# Patient Record
Sex: Male | Born: 1965 | Race: Black or African American | Hispanic: No | Marital: Married | Smoking: Current every day smoker
Health system: Southern US, Community
[De-identification: ages and names within clinical notes are randomized; demographics above are authoritative.]

## PROBLEM LIST (undated history)

## (undated) DIAGNOSIS — G8929 Other chronic pain: Secondary | ICD-10-CM

## (undated) DIAGNOSIS — R6882 Decreased libido: Secondary | ICD-10-CM

## (undated) DIAGNOSIS — D573 Sickle-cell trait: Secondary | ICD-10-CM

## (undated) DIAGNOSIS — M1990 Unspecified osteoarthritis, unspecified site: Secondary | ICD-10-CM

## (undated) DIAGNOSIS — K429 Umbilical hernia without obstruction or gangrene: Secondary | ICD-10-CM

## (undated) DIAGNOSIS — M25531 Pain in right wrist: Secondary | ICD-10-CM

## (undated) DIAGNOSIS — E119 Type 2 diabetes mellitus without complications: Secondary | ICD-10-CM

## (undated) DIAGNOSIS — F17200 Nicotine dependence, unspecified, uncomplicated: Secondary | ICD-10-CM

## (undated) DIAGNOSIS — M25539 Pain in unspecified wrist: Secondary | ICD-10-CM

## (undated) DIAGNOSIS — M25561 Pain in right knee: Secondary | ICD-10-CM

## (undated) DIAGNOSIS — G5600 Carpal tunnel syndrome, unspecified upper limb: Secondary | ICD-10-CM

## (undated) DIAGNOSIS — M25562 Pain in left knee: Secondary | ICD-10-CM

## (undated) DIAGNOSIS — E669 Obesity, unspecified: Secondary | ICD-10-CM

## (undated) DIAGNOSIS — M25532 Pain in left wrist: Secondary | ICD-10-CM

## (undated) DIAGNOSIS — M25439 Effusion, unspecified wrist: Secondary | ICD-10-CM

## (undated) DIAGNOSIS — M67431 Ganglion, right wrist: Secondary | ICD-10-CM

## (undated) HISTORY — DX: Carpal tunnel syndrome, unspecified upper limb: G56.00

## (undated) HISTORY — DX: Type 2 diabetes mellitus without complications: E11.9

## (undated) HISTORY — DX: Pain in right knee: M25.561

## (undated) HISTORY — PX: OTHER SURGICAL HISTORY: SHX169

## (undated) HISTORY — DX: Pain in unspecified wrist: M25.539

## (undated) HISTORY — DX: Pain in left knee: M25.562

## (undated) HISTORY — DX: Nicotine dependence, unspecified, uncomplicated: F17.200

## (undated) HISTORY — DX: Obesity, unspecified: E66.9

## (undated) HISTORY — DX: Pain in left wrist: M25.532

## (undated) HISTORY — DX: Pain in right wrist: M25.531

## (undated) HISTORY — DX: Decreased libido: R68.82

## (undated) HISTORY — DX: Umbilical hernia without obstruction or gangrene: K42.9

## (undated) HISTORY — DX: Sickle-cell trait: D57.3

## (undated) HISTORY — DX: Other chronic pain: G89.29

## (undated) HISTORY — DX: Effusion, unspecified wrist: M25.439

## (undated) HISTORY — DX: Ganglion, right wrist: M67.431

---

## 2003-12-10 DIAGNOSIS — M1990 Unspecified osteoarthritis, unspecified site: Secondary | ICD-10-CM

## 2003-12-10 HISTORY — DX: Unspecified osteoarthritis, unspecified site: M19.90

## 2006-01-01 ENCOUNTER — Emergency Department (HOSPITAL_COMMUNITY): Admission: EM | Admit: 2006-01-01 | Discharge: 2006-01-02 | Payer: Self-pay | Admitting: Emergency Medicine

## 2006-07-16 ENCOUNTER — Ambulatory Visit: Payer: Self-pay | Admitting: Family Medicine

## 2007-02-23 IMAGING — CT CT ANGIO CHEST
1 of 8 series · 14 of 30 positions shown · IV contrast (120 ML OMNI 300)
Comparison: none

CLINICAL DATA: Substernal chest pain. 
 CT ANGIOGRAPHY OF CHEST:
TECHNIQUE: Multidetector CT imaging of the chest was performed during bolus injection of intravenous contrast.  Multiplanar CT angiographic image reconstructions were generated to evaluate the vascular anatomy.
 Contrast:  120 cc Omnipaque 300.

[Series 3: pe · axial · 0.78mm/px · z∈[-304,-42]mm · 14 of 491 slices shown]
[im 36/491  lung]
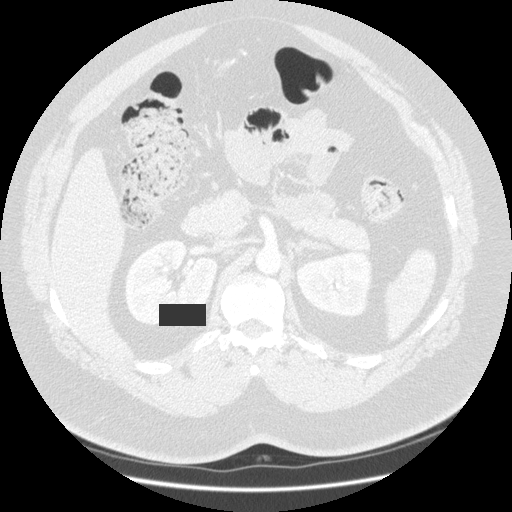
[im 71/491  mediastinal]
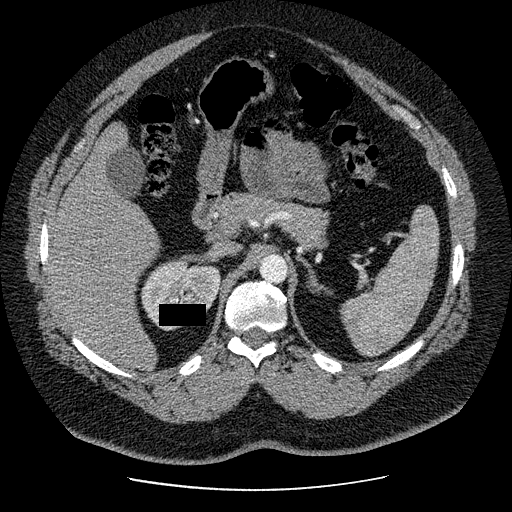
[im 106/491  lung]
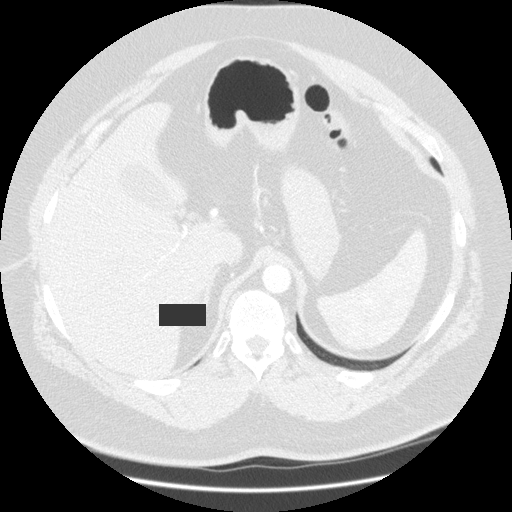
[im 141/491  mediastinal]
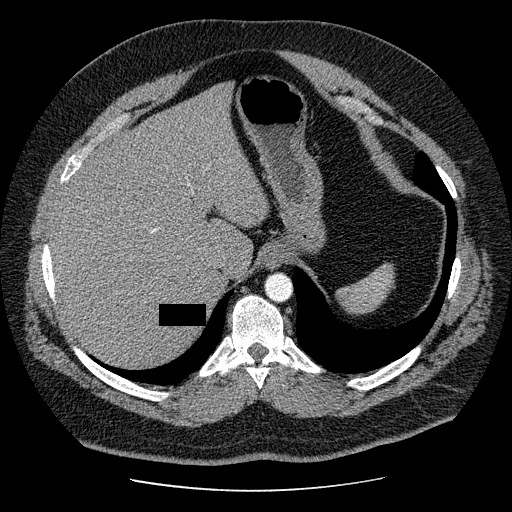
[im 176/491  lung]
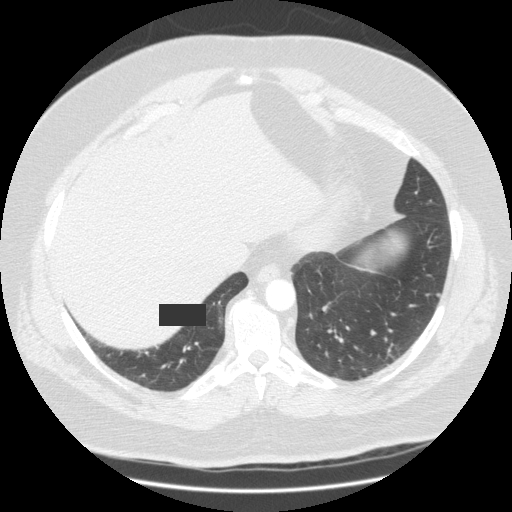
[im 211/491  mediastinal]
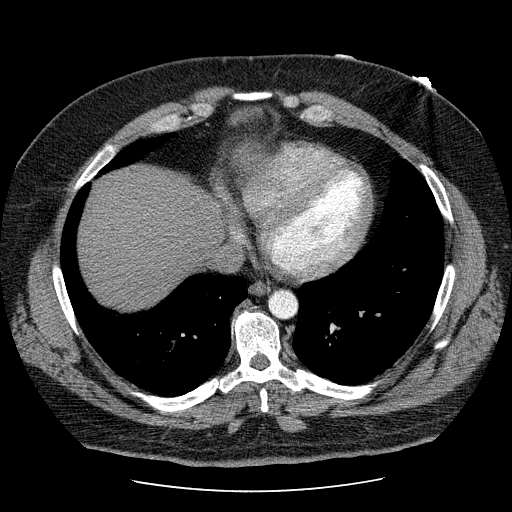
[im 230/491  lung]
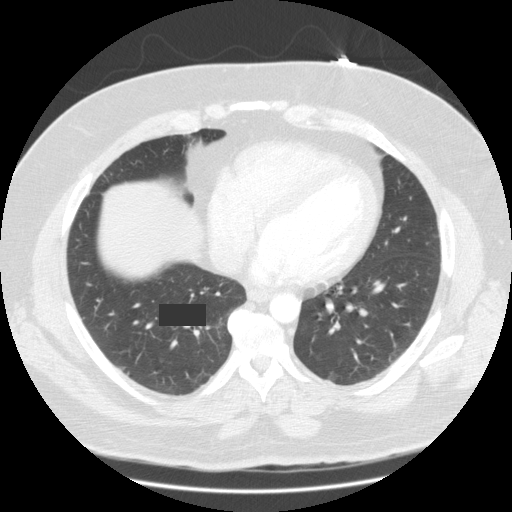
[im 246/491  mediastinal]
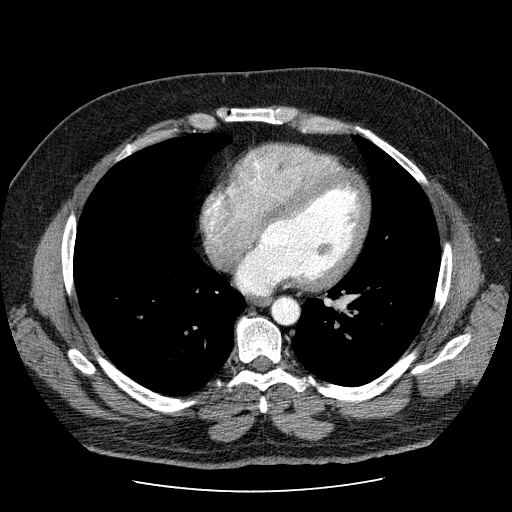
[im 281/491  lung]
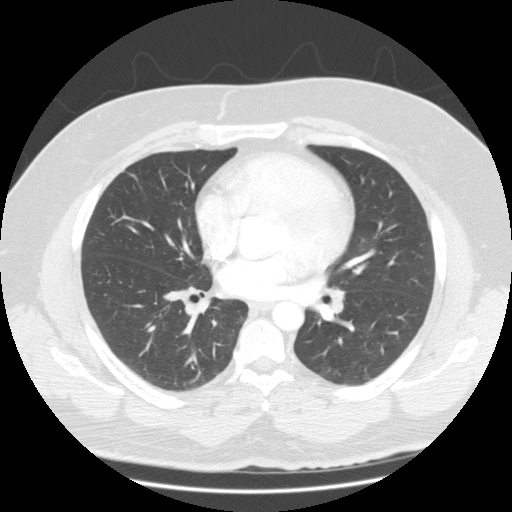
[im 316/491  mediastinal]
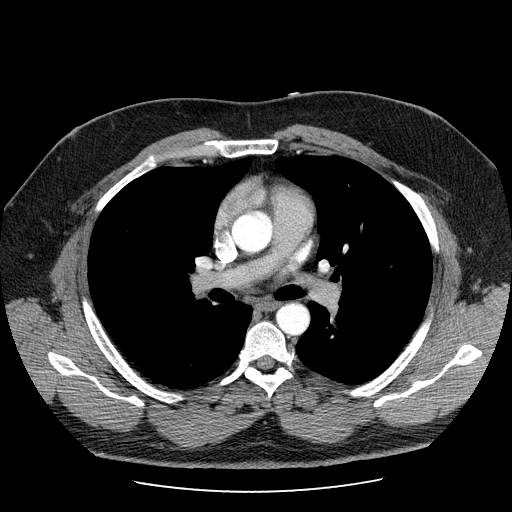
[im 351/491  lung]
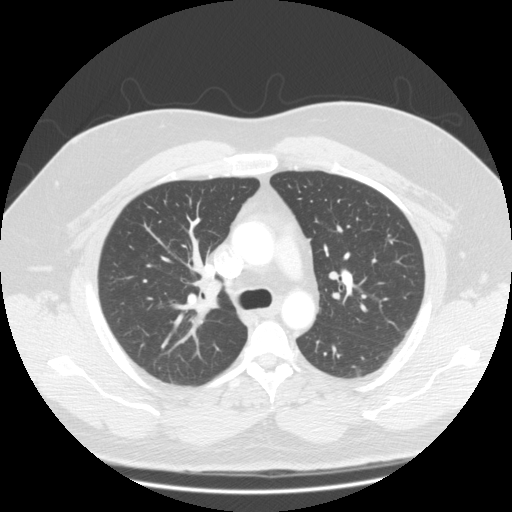
[im 386/491  mediastinal]
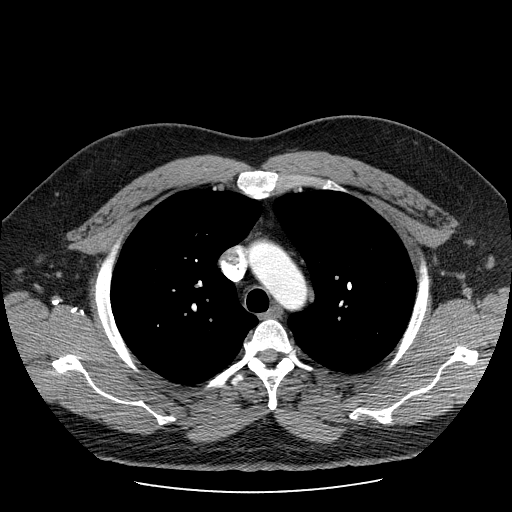
[im 421/491  lung]
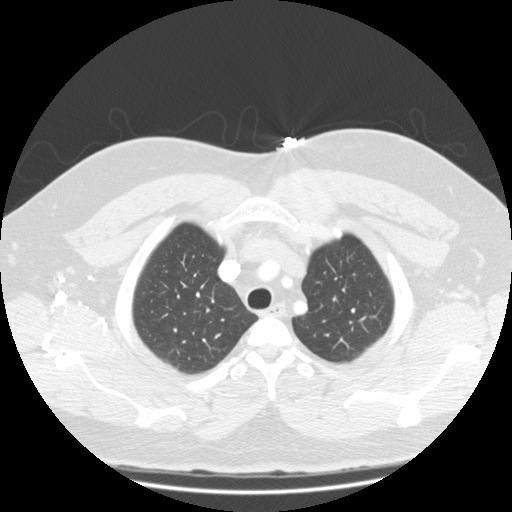
[im 456/491  mediastinal]
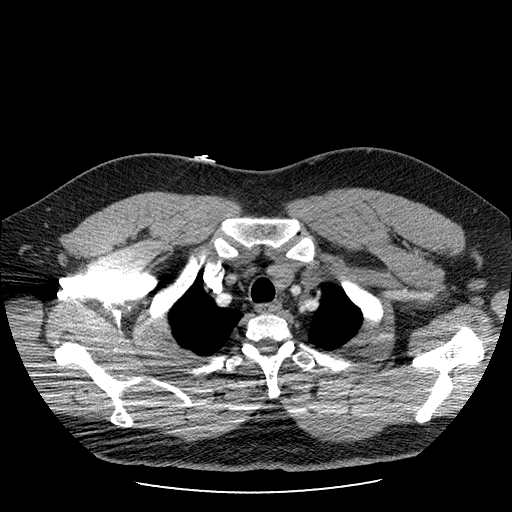

[14 of 30 positions shown; findings below may reference images not displayed]

FINDINGS: The study is technically good.  There is no CT evidence of pulmonary embolus.  No axillary, hilar, or mediastinal lymphadenopathy.  The thyroid gland appears normal.  No pleural or pericardial effusion.  There is some minimal streaky opacity in the anterior right middle lobe, likely reflecting scar or mild atelectasis.  There is also some mild dependent atelectasis present.  Incidental imaging of the upper abdomen is unremarkable.  Bones demonstrate some thoracic degenerative disease but no focal bony abnormality.
IMPRESSION: Negative for PE or other acute abnormality.

## 2015-09-26 ENCOUNTER — Ambulatory Visit (INDEPENDENT_AMBULATORY_CARE_PROVIDER_SITE_OTHER): Payer: Self-pay | Admitting: Neurology

## 2015-09-26 ENCOUNTER — Encounter: Payer: Self-pay | Admitting: Neurology

## 2015-09-26 ENCOUNTER — Ambulatory Visit (INDEPENDENT_AMBULATORY_CARE_PROVIDER_SITE_OTHER): Payer: BLUE CROSS/BLUE SHIELD | Admitting: Neurology

## 2015-09-26 DIAGNOSIS — G5602 Carpal tunnel syndrome, left upper limb: Secondary | ICD-10-CM

## 2015-09-26 DIAGNOSIS — G5601 Carpal tunnel syndrome, right upper limb: Secondary | ICD-10-CM

## 2015-09-26 DIAGNOSIS — G5600 Carpal tunnel syndrome, unspecified upper limb: Secondary | ICD-10-CM

## 2015-09-26 DIAGNOSIS — G5603 Carpal tunnel syndrome, bilateral upper limbs: Secondary | ICD-10-CM

## 2015-09-26 HISTORY — DX: Carpal tunnel syndrome, unspecified upper limb: G56.00

## 2015-09-26 NOTE — Progress Notes (Signed)
Please refer to EMG and nerve conduction study procedure note. 

## 2015-09-26 NOTE — Procedures (Signed)
     HISTORY:  Jose ParadiseRodney [name] is a 49 year old gentleman with a 2 or 3 year history of gradually worsening numbness of the hands. The patient will wake up at night with the numbness. He does report some left-sided neck and shoulder discomfort as well, without radiation down the arm. The patient being evaluated for a possible neuropathy or a cervical radiculopathy.  NERVE CONDUCTION STUDIES:  Nerve conduction studies were performed on both upper extremities. The distal motor latencies for the median nerves were prolonged bilaterally with normal motor amplitudes for these nerves bilaterally. The distal motor latencies and motor amplitudes for the ulnar nerves were normal bilaterally. The F wave latencies for the median nerves were prolonged bilaterally, normal for the ulnar nerves bilaterally. The nerve conduction velocities for the median and ulnar nerves were normal bilaterally. The sensory latencies for the median nerves were prolonged bilaterally, normal for the ulnar nerves bilaterally.  EMG STUDIES:  EMG study was performed on the left upper extremity:  The first dorsal interosseous muscle reveals 2 to 4 K units with full recruitment. No fibrillations or positive waves were noted. The abductor pollicis brevis muscle reveals 2 to 4 K units with full recruitment. No fibrillations or positive waves were noted. The extensor indicis proprius muscle reveals 1 to 3 K units with full recruitment. No fibrillations or positive waves were noted. The pronator teres muscle reveals 2 to 3 K units with full recruitment. No fibrillations or positive waves were noted. The biceps muscle reveals 1 to 2 K units with full recruitment. No fibrillations or positive waves were noted. The triceps muscle reveals 2 to 4 K units with full recruitment. No fibrillations or positive waves were noted. The anterior deltoid muscle reveals 2 to 3 K units with full recruitment. No fibrillations or positive waves were noted. The  cervical paraspinal muscles were tested at 2 levels. No abnormalities of insertional activity were seen at either level tested. There was good relaxation.   IMPRESSION:  Nerve conduction studies done on both upper extremities shows evidence of mild bilateral carpal tunnel syndrome. EMG evaluation of the left upper extremity was unremarkable, without evidence of an overlying cervical radiculopathy.  Marlan Palau. Keith Zarian Colpitts MD 09/26/2015 11:07 AM  [facility] [address] [place], [place] [id number]  Phone [phone] Fax [phone]

## 2016-02-20 ENCOUNTER — Ambulatory Visit: Payer: Self-pay | Admitting: General Surgery

## 2016-03-16 ENCOUNTER — Ambulatory Visit (INDEPENDENT_AMBULATORY_CARE_PROVIDER_SITE_OTHER): Payer: BLUE CROSS/BLUE SHIELD

## 2016-03-16 ENCOUNTER — Ambulatory Visit (INDEPENDENT_AMBULATORY_CARE_PROVIDER_SITE_OTHER): Payer: BLUE CROSS/BLUE SHIELD | Admitting: Family Medicine

## 2016-03-16 VITALS — BP 132/76 | HR 78 | Temp 98.3°F | Resp 18 | Ht 70.0 in | Wt 312.0 lb

## 2016-03-16 DIAGNOSIS — J309 Allergic rhinitis, unspecified: Secondary | ICD-10-CM | POA: Diagnosis not present

## 2016-03-16 DIAGNOSIS — R05 Cough: Secondary | ICD-10-CM

## 2016-03-16 DIAGNOSIS — J209 Acute bronchitis, unspecified: Secondary | ICD-10-CM | POA: Diagnosis not present

## 2016-03-16 DIAGNOSIS — R059 Cough, unspecified: Secondary | ICD-10-CM

## 2016-03-16 LAB — POCT CBC
GRANULOCYTE PERCENT: 59.9 % (ref 37–80)
HCT, POC: 44.5 % (ref 43.5–53.7)
HEMOGLOBIN: 15.6 g/dL (ref 14.1–18.1)
Lymph, poc: 1.7 (ref 0.6–3.4)
MCH: 33.1 pg — AB (ref 27–31.2)
MCHC: 35.1 g/dL (ref 31.8–35.4)
MCV: 94.4 fL (ref 80–97)
MID (CBC): 0.4 (ref 0–0.9)
MPV: 7.6 fL (ref 0–99.8)
PLATELET COUNT, POC: 225 10*3/uL (ref 142–424)
POC Granulocyte: 3.1 (ref 2–6.9)
POC LYMPH PERCENT: 32 %L (ref 10–50)
POC MID %: 8.1 %M (ref 0–12)
RBC: 4.71 M/uL (ref 4.69–6.13)
RDW, POC: 11.9 %
WBC: 5.2 10*3/uL (ref 4.6–10.2)

## 2016-03-16 MED ORDER — ALBUTEROL SULFATE (2.5 MG/3ML) 0.083% IN NEBU
2.5000 mg | INHALATION_SOLUTION | Freq: Once | RESPIRATORY_TRACT | Status: AC
  Administered 2016-03-16: 2.5 mg via RESPIRATORY_TRACT

## 2016-03-16 MED ORDER — FLUTICASONE PROPIONATE 50 MCG/ACT NA SUSP: 2.0000 | Freq: Every day | NASAL | Status: DC

## 2016-03-16 MED ORDER — BENZONATATE 200 MG PO CAPS: 200.0000 mg | ORAL_CAPSULE | Freq: Three times a day (TID) | ORAL | Status: DC | PRN

## 2016-03-16 MED ORDER — CETIRIZINE HCL 10 MG PO TABS: 10.0000 mg | ORAL_TABLET | Freq: Every day | ORAL | Status: DC

## 2016-03-16 MED ORDER — IPRATROPIUM BROMIDE 0.02 % IN SOLN
0.5000 mg | Freq: Once | RESPIRATORY_TRACT | Status: AC
  Administered 2016-03-16: 0.5 mg via RESPIRATORY_TRACT

## 2016-03-16 MED ORDER — AZITHROMYCIN 250 MG PO TABS: ORAL_TABLET | ORAL | Status: DC

## 2016-03-16 NOTE — Progress Notes (Signed)
Subjective:  By signing my name below, I, Jose [name], attest that this documentation has been prepared under the direction and in the presence of Norberto Sorenson, MD.  Electronically Signed: Andrew Au, ED Scribe. 03/16/2016. 9:57 AM.   Patient ID: Jose [name], male    DOB: June 24, 1966, 50 y.o.   MRN: [id number]  HPI Chief Complaint  Patient presents with  . Cough    x 1 month 1/2, non productive  . Nasal Congestion  . Fever  . Chills  . Emesis  . Generalized Body Aches    HPI Comments: Jose [name] is a 50 y.o. male who presents to the [facility] complaining of persistent, dry cough for 2 months. Pt states cough and congestion started 2 months ago, which improved but cough persisted. He reports 2 day ago along with persistent cough he developed fever, chills, body aches, and sore throat which he states has improved but cough has continued. He describes cough as annoying and dry but denies it being hacking, productive and causing him to vomit. He has been taking OTC medication including Dayquil and Nyquil with relief. He has not tried allergy medication and has never been on an inhaler. He denies nasal congestion, CP, SOB and wheezing. He denies hx of asthma and emphysema. He did not get flu shot this year. Pt smokes a half a pack a day.   Riki Altes., MD   Patient Active Problem List   Diagnosis Date Noted  . Carpal tunnel syndrome 09/26/2015   Past Medical History  Diagnosis Date  . Carpal tunnel syndrome 09/26/2015    Bilateral  . Sickle cell trait (HCC)    History reviewed. No pertinent past surgical history. No Known Allergies Prior to Admission medications   Medication Sig Start Date End Date Taking? Authorizing Provider  Cholecalciferol (VITAMIN D PO) Take by mouth.   Yes Historical Provider, MD   Social History   Social History  . Marital Status: Married    Spouse Name: N/A  . Number of Children: N/A  . Years of Education: N/A    Occupational History  . Not on file.   Social History Main Topics  . Smoking status: Current Every Day Smoker  . Smokeless tobacco: Not on file  . Alcohol Use: Not on file  . Drug Use: Not on file  . Sexual Activity: Not on file   Other Topics Concern  . Not on file   Social History Narrative   Review of Systems  Constitutional: Positive for fever ( improved) and chills ( improved).  HENT: Positive for sore throat. Negative for congestion.   Respiratory: Positive for cough. Negative for chest tightness, shortness of breath and wheezing.   Cardiovascular: Negative for chest pain.  Musculoskeletal: Positive for myalgias (improved).       Objective:   Physical Exam  Constitutional: He is oriented to person, place, and time. He appears well-developed and well-nourished. No distress.  HENT:  Head: Normocephalic and atraumatic.  Right Ear: A middle ear effusion is present.  Left Ear: A middle ear effusion is present.  Nose: Rhinorrhea present.  Mouth/Throat: Posterior oropharyngeal erythema present.  Nares boggy.  Tonsil 2+  Eyes: Conjunctivae and EOM are normal.  Neck: Neck supple. No thyromegaly present.  Cardiovascular: Normal rate, regular rhythm and normal heart sounds.   No murmur heard. Pulmonary/Chest: Effort normal and breath sounds normal. He has no wheezes. He has no rales.  Musculoskeletal: Normal range of motion.  Lymphadenopathy:    He has no cervical adenopathy.  Neurological: He is alert and oriented to person, place, and time.  Skin: Skin is warm and dry.  Acanthosis nigricans. Hyperpigmented, scaly rash on his shoulder.  Psychiatric: He has a normal mood and affect. His behavior is normal.  Nursing note and vitals reviewed.  Filed Vitals:   03/16/16 0938  BP: 132/76  Pulse: 78  Temp: 98.3 F (36.8 C)  TempSrc: Oral  Resp: 18  Height:  (1.778 m)  Weight: 312 lb (141.522 kg)  SpO2: 97%   Results for orders placed or performed in visit  on 03/16/16  POCT CBC  Result Value Ref Range   WBC 5.2 4.6 - 10.2 K/uL   Lymph, poc 1.7 0.6 - 3.4   POC LYMPH PERCENT 32.0 10 - 50 %L   MID (cbc) 0.4 0 - 0.9   POC MID % 8.1 0 - 12 %M   POC Granulocyte 3.1 2 - 6.9   Granulocyte percent 59.9 37 - 80 %G   RBC 4.71 4.69 - 6.13 M/uL   Hemoglobin 15.6 14.1 - 18.1 g/dL   HCT, POC 16.1 09.6 - 53.7 %   MCV 94.4 80 - 97 fL   MCH, POC 33.1 (A) 27 - 31.2 pg   MCHC 35.1 31.8 - 35.4 g/dL   RDW, POC 04.5 %   Platelet Count, POC 225 142 - 424 K/uL   MPV 7.6 0 - 99.8 fL   Dg Chest 2 View  03/16/2016  CLINICAL DATA:  complaining of persistent, dry cough for 2 months. Pt states cough and congestion started 2 months ago, which improved but cough persisted. EXAM: CHEST  2 VIEW COMPARISON:  05/15/2008 FINDINGS: The heart size and mediastinal contours are within normal limits. Both lungs are clear. No pleural effusion or pneumothorax. Bony thorax is intact. IMPRESSION: No active cardiopulmonary disease. Electronically Signed   By: Amie Portland M.D.   On: 03/16/2016 11:15    Assessment & Plan:   1. Cough   2. Acute bronchitis, unspecified organism   3. Allergic rhinitis, unspecified allergic rhinitis type     Orders Placed This Encounter  Procedures  . DG Chest 2 View    Standing Status: Future     Number of Occurrences: 1     Standing Expiration Date: 03/16/2017    Order Specific Question:  Reason for Exam (SYMPTOM  OR DIAGNOSIS REQUIRED)    Answer:  cough x 6 wks in smoker    Order Specific Question:  Preferred imaging location?    Answer:  External  . POCT CBC    Meds ordered this encounter  Medications  . Cholecalciferol (VITAMIN D PO)    Sig: Take by mouth.  Marland Kitchen albuterol (PROVENTIL) (2.5 MG/3ML) 0.083% nebulizer solution 2.5 mg    Sig:   . ipratropium (ATROVENT) nebulizer solution 0.5 mg    Sig:   . azithromycin (ZITHROMAX) 250 MG tablet    Sig: Take 2 tabs PO x 1 dose, then 1 tab PO QD x 4 days    Dispense:  6 tablet    Refill:  0   . benzonatate (TESSALON) 200 MG capsule    Sig: Take 1 capsule (200 mg total) by mouth 3 (three) times daily as needed for cough.    Dispense:  40 capsule    Refill:  0  . cetirizine (ZYRTEC) 10 MG tablet    Sig: Take 1 tablet (10 mg total) by mouth at bedtime.  Dispense:  30 tablet    Refill:  11  . fluticasone (FLONASE) 50 MCG/ACT nasal spray    Sig: Place 2 sprays into both nostrils at bedtime.    Dispense:  16 g    Refill:  2    I personally performed the services described in this documentation, which was scribed in my presence. The recorded information has been reviewed and considered, and addended by me as needed.  Norberto SorensonEva Elnoria Livingston, MD MPH

## 2016-03-16 NOTE — Patient Instructions (Addendum)
   IF you received an x-ray today, you will receive an invoice from [facility]. Please contact [facility] at [phone] with questions or concerns regarding your invoice.   IF you received labwork today, you will receive an invoice from [facility]. Please contact [facility] at [phone] with questions or concerns regarding your invoice.   Our billing staff will not be able to assist you with questions regarding bills from these companies.  You will be contacted with the lab results as soon as they are available. The fastest way to get your results is to activate your My Chart account. Instructions are located on the last page of this paperwork. If you have not heard from us regarding the results in 2 weeks, please contact this office.    Acute Bronchitis Bronchitis is inflammation of the airways that extend from the windpipe into the lungs (bronchi). The inflammation often causes mucus to develop. This leads to a cough, which is the most common symptom of bronchitis.  In acute bronchitis, the condition usually develops suddenly and goes away over time, usually in a couple weeks. Smoking, allergies, and asthma can make bronchitis worse. Repeated episodes of bronchitis may cause further lung problems.  CAUSES Acute bronchitis is most often caused by the same virus that causes a cold. The virus can spread from person to person (contagious) through coughing, sneezing, and touching contaminated objects. SIGNS AND SYMPTOMS   Cough.   Fever.   Coughing up mucus.   Body aches.   Chest congestion.   Chills.   Shortness of breath.   Sore throat.  DIAGNOSIS  Acute bronchitis is usually diagnosed through a physical exam. Your health care provider will also ask you questions about your medical history. Tests, such as chest X-rays, are sometimes done to rule out other conditions.  TREATMENT  Acute bronchitis usually goes away in a  couple weeks. Oftentimes, no medical treatment is necessary. Medicines are sometimes given for relief of fever or cough. Antibiotic medicines are usually not needed but may be prescribed in certain situations. In some cases, an inhaler may be recommended to help reduce shortness of breath and control the cough. A cool mist vaporizer may also be used to help thin bronchial secretions and make it easier to clear the chest.  HOME CARE INSTRUCTIONS  Get plenty of rest.   Drink enough fluids to keep your urine clear or pale yellow (unless you have a medical condition that requires fluid restriction). Increasing fluids may help thin your respiratory secretions (sputum) and reduce chest congestion, and it will prevent dehydration.   Take medicines only as directed by your health care provider.  If you were prescribed an antibiotic medicine, finish it all even if you start to feel better.  Avoid smoking and secondhand smoke. Exposure to cigarette smoke or irritating chemicals will make bronchitis worse. If you are a smoker, consider using nicotine gum or skin patches to help control withdrawal symptoms. Quitting smoking will help your lungs heal faster.   Reduce the chances of another bout of acute bronchitis by washing your hands frequently, avoiding people with cold symptoms, and trying not to touch your hands to your mouth, nose, or eyes.   Keep all follow-up visits as directed by your health care provider.  SEEK MEDICAL CARE IF: Your symptoms do not improve after 1 week of treatment.  SEEK IMMEDIATE MEDICAL CARE IF:  You develop an increased fever or chills.   You have chest pain.     You have severe shortness of breath.  You have bloody sputum.   You develop dehydration.  You faint or repeatedly feel like you are going to pass out.  You develop repeated vomiting.  You develop a severe headache. MAKE SURE YOU:   Understand these instructions.  Will watch your  condition.  Will get help right away if you are not doing well or get worse.   This information is not intended to replace advice given to you by your health care provider. Make sure you discuss any questions you have with your health care provider.   Document Released: 01/02/2005 Document Revised: 12/16/2014 Document Reviewed: 05/18/2013 Elsevier Interactive Patient Education 2016 Elsevier Inc.  

## 2017-05-08 IMAGING — CR DG CHEST 2V
2 series · 2 of 2 positions shown · non-contrast
Comparison: 05/15/2008

CLINICAL DATA: complaining of persistent, dry cough for 2 months.
Pt states cough and congestion started 2 months ago, which improved
but cough persisted.

EXAM:
CHEST  2 VIEW

[PA]
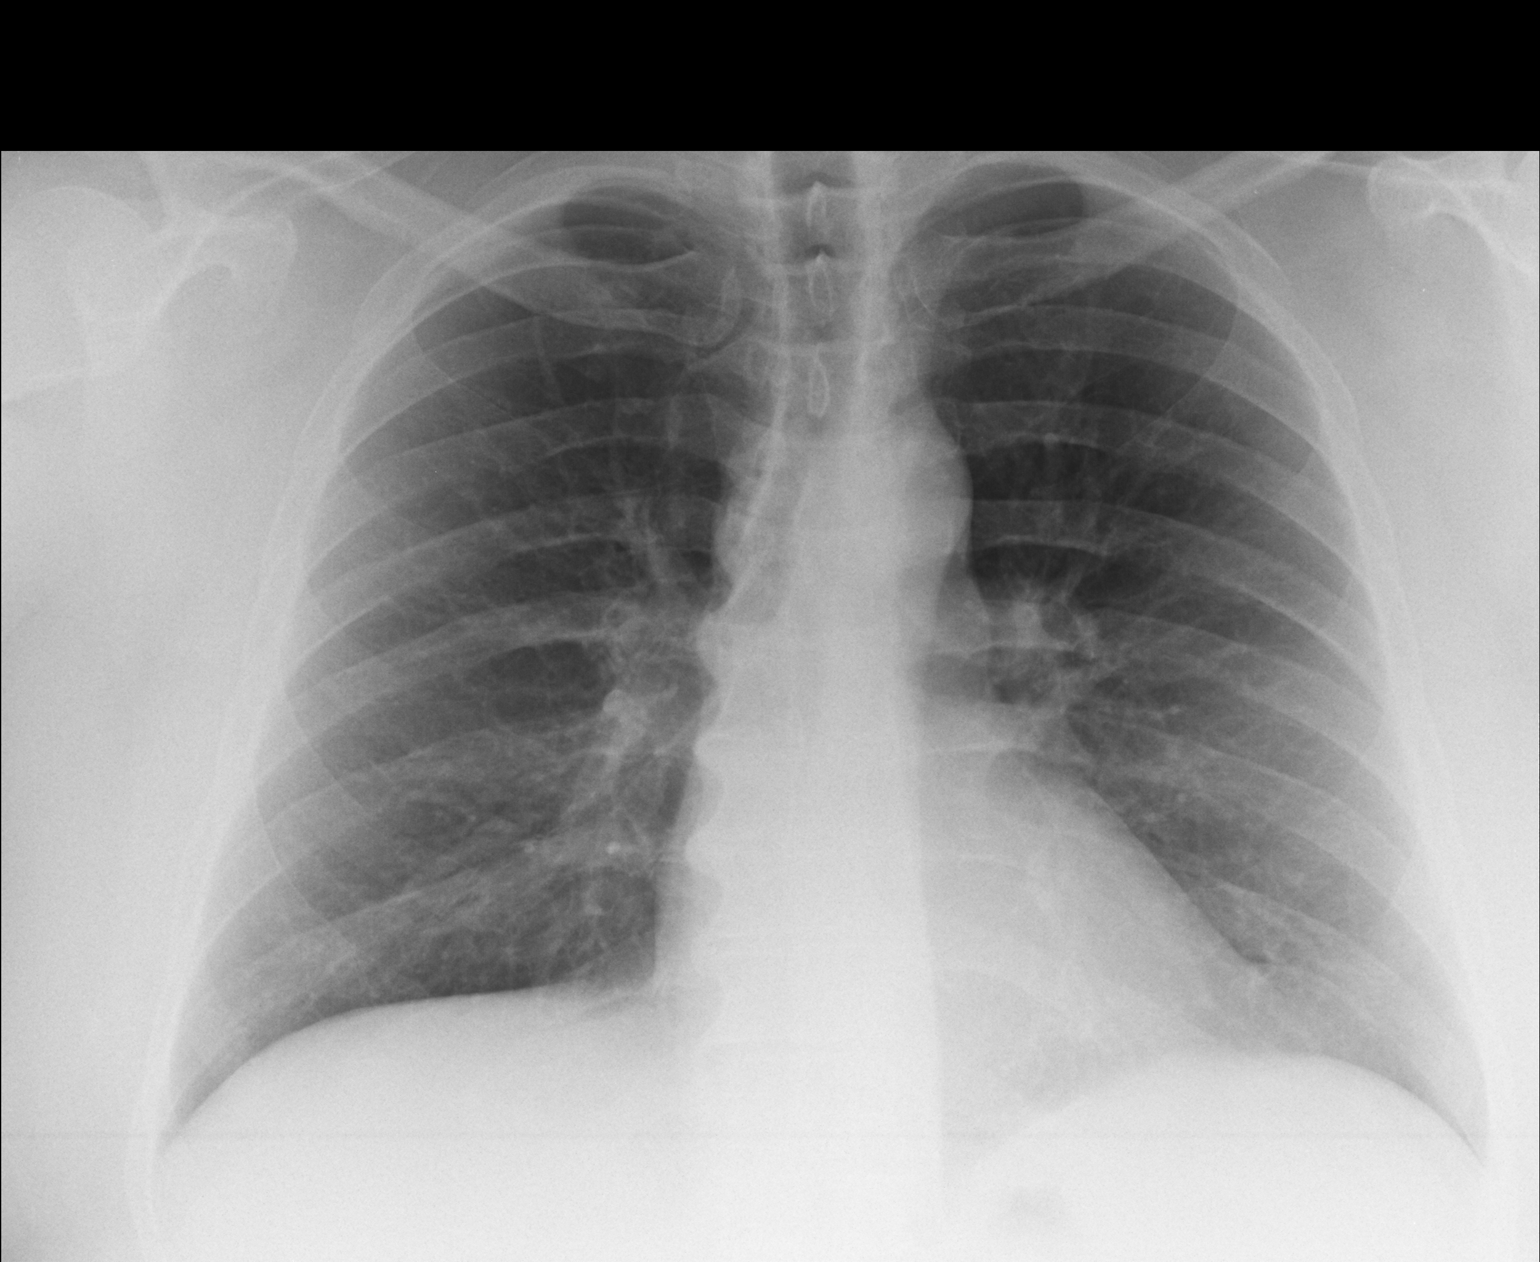

[lateral]
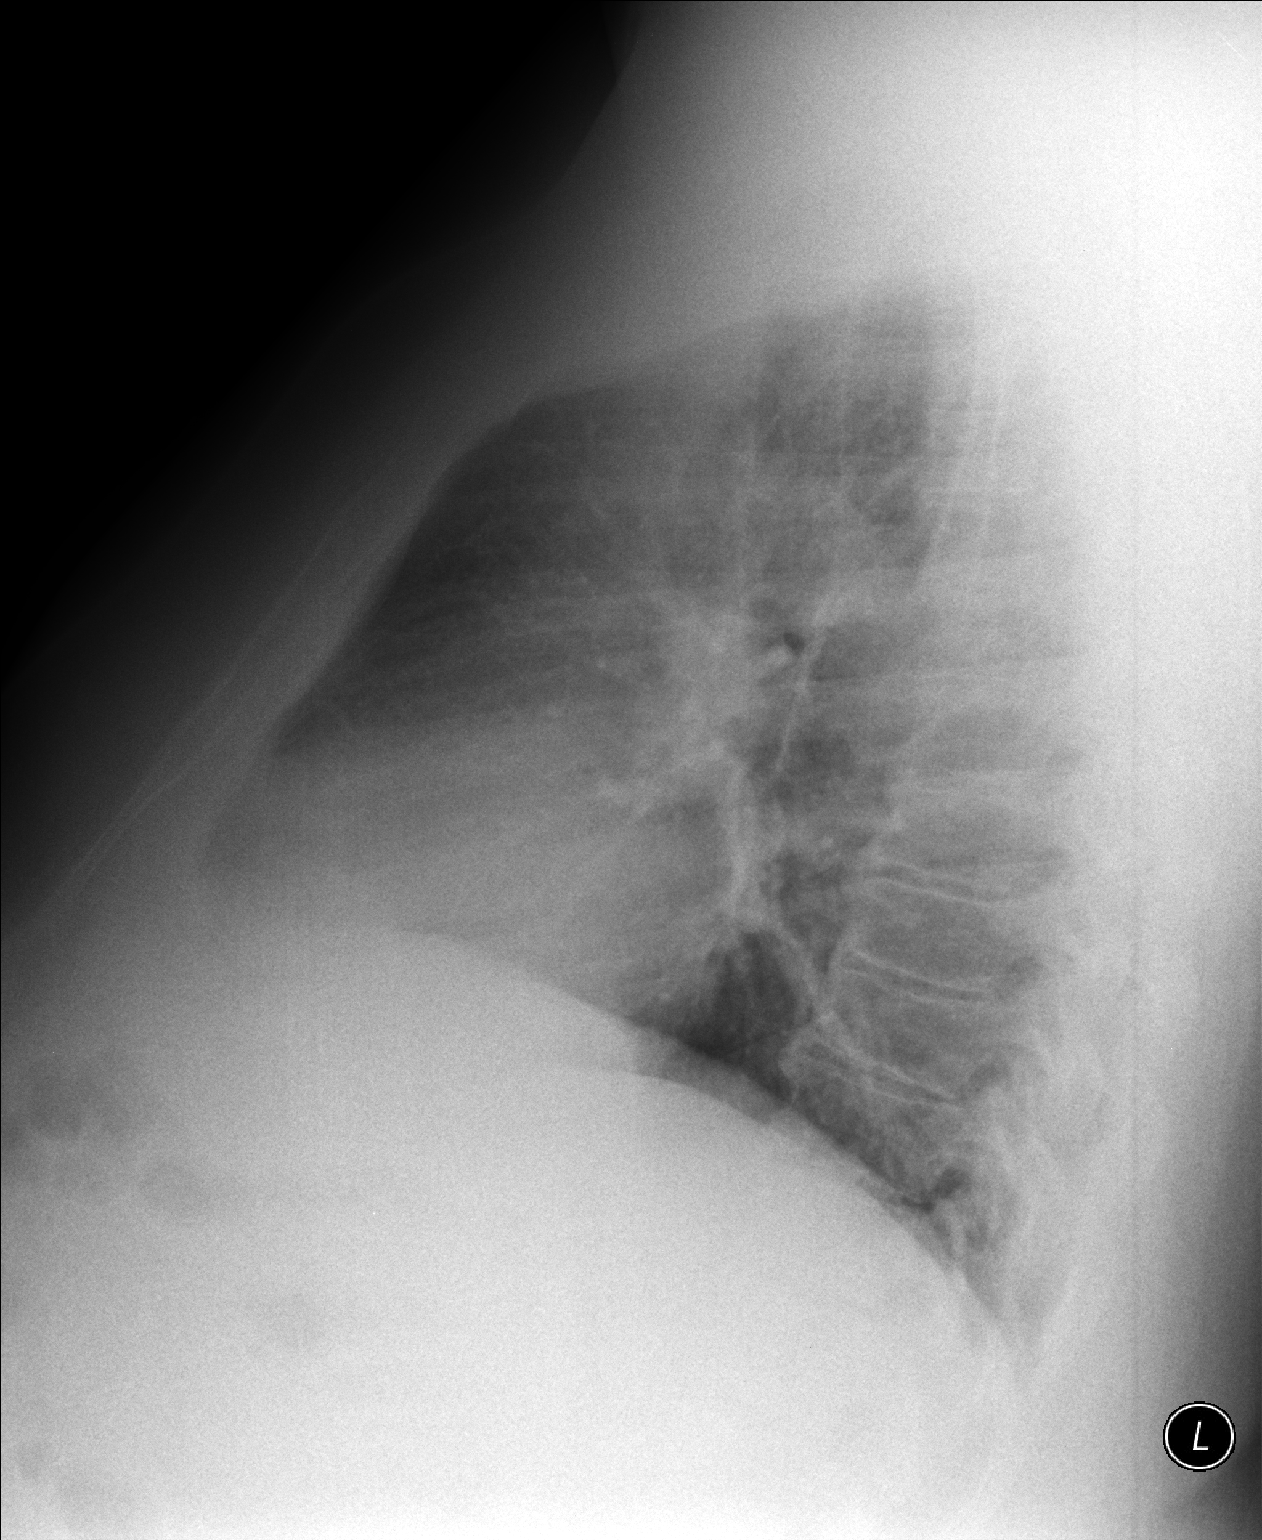

[2 of 2 positions shown; findings below may reference images not displayed]

FINDINGS: The heart size and mediastinal contours are within normal limits.
Both lungs are clear. No pleural effusion or pneumothorax.

Bony thorax is intact.
IMPRESSION: No active cardiopulmonary disease.

## 2019-01-18 ENCOUNTER — Ambulatory Visit: Payer: No Typology Code available for payment source | Admitting: Medical

## 2019-01-18 ENCOUNTER — Encounter: Payer: Self-pay | Admitting: Medical

## 2019-01-18 VITALS — BP 130/80 | HR 71 | Temp 98.4°F | Resp 16 | Ht 70.0 in | Wt 310.4 lb

## 2019-01-18 DIAGNOSIS — E669 Obesity, unspecified: Secondary | ICD-10-CM

## 2019-01-18 DIAGNOSIS — F17200 Nicotine dependence, unspecified, uncomplicated: Secondary | ICD-10-CM | POA: Insufficient documentation

## 2019-01-18 DIAGNOSIS — M25532 Pain in left wrist: Secondary | ICD-10-CM

## 2019-01-18 DIAGNOSIS — G5600 Carpal tunnel syndrome, unspecified upper limb: Secondary | ICD-10-CM

## 2019-01-18 DIAGNOSIS — M67431 Ganglion, right wrist: Secondary | ICD-10-CM

## 2019-01-18 DIAGNOSIS — M25531 Pain in right wrist: Secondary | ICD-10-CM

## 2019-01-18 DIAGNOSIS — G8929 Other chronic pain: Secondary | ICD-10-CM

## 2019-01-18 DIAGNOSIS — Z01818 Encounter for other preprocedural examination: Secondary | ICD-10-CM | POA: Diagnosis not present

## 2019-01-18 DIAGNOSIS — M25439 Effusion, unspecified wrist: Secondary | ICD-10-CM

## 2019-01-18 DIAGNOSIS — R6882 Decreased libido: Secondary | ICD-10-CM

## 2019-01-18 DIAGNOSIS — K429 Umbilical hernia without obstruction or gangrene: Secondary | ICD-10-CM

## 2019-01-18 DIAGNOSIS — M25562 Pain in left knee: Secondary | ICD-10-CM

## 2019-01-18 DIAGNOSIS — M25561 Pain in right knee: Secondary | ICD-10-CM

## 2019-01-18 HISTORY — DX: Umbilical hernia without obstruction or gangrene: K42.9

## 2019-01-18 HISTORY — DX: Other chronic pain: G89.29

## 2019-01-18 HISTORY — DX: Ganglion, right wrist: M67.431

## 2019-01-18 HISTORY — DX: Pain in right wrist: M25.531

## 2019-01-18 HISTORY — DX: Effusion, unspecified wrist: M25.439

## 2019-01-18 NOTE — Progress Notes (Signed)
Subjective: Chief Complaint  Patient presents with  . NP    Jose [name]  hernia x 7 years, joint pain and swelling   Here as a new patient today, referred by his wife who sees Jose Jose [name] here.  He was seeing Dr. Renae GlossShelton prior at [facility] prior.  He notes that he has a hernia in umbilicus.   Had hernia for 7 years, but lately it has gotten bigger.  Saw a surgeon years ago, but never went back due to being busy.   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   The hernia sometimes gives discomfort and is unsightly, hangs out.  Gets some pains in wrists and knees, gets some swelling in wrists.   Mainly pain and swelling in wrists.   No prior screening for rheumatoid disease.  Had nerve studies 3 years ago, but was told he didn't have carpal tunnel surgery.  Tried some hemp creams and gels but that didn't help.  Uses Ibuprofen 600mg .   Works all the time, owns some restaurants.  Uses splint on left wrist and bandage on right wrist at night time.     No family history of rheumatoid disease.  No other joint swelling.  Has hx/o meniscal injury, reported by prior orthopedist, but no surgery.  On his feet 16 hours per day.  Attributes some of his knee pain to his weight.   Denies chest pain, shortness of breath, dyspnea on exertion.  He notes that he walks a lot at work running his [employer] businesses but no other specific exercise.  He only eats once a day.  He drinks a lot of soda.  He is a 1 pack a day smoker  Past Medical History:  Diagnosis Date  . Carpal tunnel syndrome 09/26/2015   Bilateral  . Low libido   . Obesity   . Sickle cell trait (HCC)   . Smoker   . Wrist pain    Current Outpatient Medications on File Prior to Visit  Medication Sig Dispense Refill  . ibuprofen (ADVIL,MOTRIN) 600 MG tablet Take 600 mg by mouth every 6 (six) hours as needed.     No current facility-administered medications on file prior to visit.    ROS as in  subjective     Objective: BP 130/80   Pulse 71   Temp 98.4 F (36.9 C) (Oral)   Resp 16   Ht 5\' 10"  (1.778 m)   Wt (!) 310 lb 6.4 oz (140.8 kg)   SpO2 96%   BMI 44.54 kg/m   General appearance: alert, no distress, WD/WN, obese African-American male Oral cavity: MMM, no lesions Neck: supple, no lymphadenopathy, no thyromegaly, no masses, no bruits Heart: RRR, normal S1, S2, no murmurs Lungs: CTA bilaterally, no wheezes, rhonchi, or rales Abdomen: +bs, soft, relatively large non reducible umbilical hernia, otherwise abdomen non tender, non distended, no masses, no hepatomegaly, no splenomegaly Left dorsal wrist laterally with localized swelling, tender over left wrist in general, bilateral wrist with slightly decreased range of motion in general, ganglion cyst present on volar right lateral wrist approximately 1 cm diameter, mild bony arthritic changes of the MCPs, otherwise non tender no other deformity Hands and arms neurovascularly intact Pulses: 2+ symmetric, upper and lower extremities, normal cap refill No lower extremity edema  EKG done for preop today Rate 74 bpm, normal sinus rhythm, PR interval 176 ms, QRS duration 104 ms, QTC 395 ms, axis 55 degrees, poor R wave  progression, no prior EKG to compare  Assessment: Encounter Diagnoses  Name Primary?  . Preop examination Yes  . Umbilical hernia without obstruction and without gangrene   . Smoker   . Wrist swelling, unspecified laterality   . Pain in both wrists   . Carpal tunnel syndrome, unspecified laterality   . Ganglion cyst of wrist, right   . Obesity, unspecified classification, unspecified obesity type, unspecified whether serious comorbidity present   . Low libido   . Chronic pain of both knees      Plan: We discussed his concerns and several symptoms.  We will plan referral to general surgery for umbilical hernia but will also likely need cardiac clearance given EKG findings today.  Labs  today  Counseled on smoking cessation, counseled on losing weight and working on lifestyle changes  Labs below given the wrist swelling and wrist pain, reviewed nerve conduction study done back in 2016 which was mildly abnormal for carpal tunnel syndrome  Ganglion cyst-reassured  We will address his knee pain at a later visit as well as his low libido concerns   [name] was seen today for Jose [name].  Diagnoses and all orders for this visit:  Preop examination -     Comprehensive metabolic panel -     CBC -     Sedimentation rate -     CYCLIC CITRUL PEPTIDE ANTIBODY, IGG/IGA -     EKG 12-Lead  Umbilical hernia without obstruction and without gangrene -     Ambulatory referral to General Surgery  Smoker  Wrist swelling, unspecified laterality -     Sedimentation rate -     CYCLIC CITRUL PEPTIDE ANTIBODY, IGG/IGA  Pain in both wrists -     Sedimentation rate -     CYCLIC CITRUL PEPTIDE ANTIBODY, IGG/IGA  Carpal tunnel syndrome, unspecified laterality  Ganglion cyst of wrist, right  Obesity, unspecified classification, unspecified obesity type, unspecified whether serious comorbidity present  Low libido  Chronic pain of both knees

## 2019-01-19 LAB — COMPREHENSIVE METABOLIC PANEL
ALBUMIN: 4.1 g/dL (ref 3.8–4.9)
ALT: 36 IU/L (ref 0–44)
AST: 33 IU/L (ref 0–40)
Albumin/Globulin Ratio: 1.4 (ref 1.2–2.2)
Alkaline Phosphatase: 74 IU/L (ref 39–117)
BUN / CREAT RATIO: 13 (ref 9–20)
BUN: 10 mg/dL (ref 6–24)
Bilirubin Total: 0.5 mg/dL (ref 0.0–1.2)
CHLORIDE: 102 mmol/L (ref 96–106)
CO2: 23 mmol/L (ref 20–29)
CREATININE: 0.79 mg/dL (ref 0.76–1.27)
Calcium: 9.3 mg/dL (ref 8.7–10.2)
GFR calc non Af Amer: 102 mL/min/{1.73_m2} (ref >59)
GFR, EST AFRICAN AMERICAN: 118 mL/min/{1.73_m2} (ref >59)
Globulin, Total: 3 g/dL (ref 1.5–4.5)
Glucose: 105 mg/dL — ABNORMAL HIGH (ref 65–99)
Potassium: 4.5 mmol/L (ref 3.5–5.2)
Sodium: 139 mmol/L (ref 134–144)
TOTAL PROTEIN: 7.1 g/dL (ref 6.0–8.5)

## 2019-01-19 LAB — SEDIMENTATION RATE: Sed Rate: 16 mm/hr (ref 0–30)

## 2019-01-19 LAB — CBC
HEMOGLOBIN: 15 g/dL (ref 13.0–17.7)
Hematocrit: 44.3 % (ref 37.5–51.0)
MCH: 32.7 pg (ref 26.6–33.0)
MCHC: 33.9 g/dL (ref 31.5–35.7)
MCV: 97 fL (ref 79–97)
Platelets: 240 10*3/uL (ref 150–450)
RBC: 4.59 x10E6/uL (ref 4.14–5.80)
RDW: 10.6 % — AB (ref 11.6–15.4)
WBC: 5.4 10*3/uL (ref 3.4–10.8)

## 2019-01-19 LAB — CYCLIC CITRUL PEPTIDE ANTIBODY, IGG/IGA: CYCLIC CITRULLIN PEPTIDE AB: 11 U (ref 0–19)

## 2019-01-21 ENCOUNTER — Telehealth: Payer: Self-pay | Admitting: Medical

## 2019-01-21 NOTE — Telephone Encounter (Signed)
Pts wife called and said they received a call from someone at this office yesterday about a setting up something with a cardiologist? Pt can be reached at [phone]

## 2019-01-21 NOTE — Telephone Encounter (Signed)
See other message.    Given the EKG findings,  I want him to have cardiology consult before pursuing surgery.  We can still get him into general surgery for consult, but lets get cardiology clearance as well.

## 2019-01-22 ENCOUNTER — Other Ambulatory Visit: Payer: Self-pay

## 2019-01-22 ENCOUNTER — Telehealth: Payer: Self-pay

## 2019-01-22 DIAGNOSIS — R9431 Abnormal electrocardiogram [ECG] [EKG]: Secondary | ICD-10-CM

## 2019-01-22 NOTE — Telephone Encounter (Signed)
Patient has an appointment on 01-29-19 with Dr Luisa Hart at [facility] at 1010 needs to be there at [phone].    I am also putting in referral to cardiology

## 2019-01-22 NOTE — Telephone Encounter (Signed)
Patient's wife notified of lab results and recommendations. 

## 2019-01-28 NOTE — Telephone Encounter (Signed)
Left voicemail for patient to call back and schedule new patient appointment.

## 2019-01-29 ENCOUNTER — Other Ambulatory Visit: Payer: Self-pay

## 2019-01-29 ENCOUNTER — Ambulatory Visit: Payer: Self-pay | Admitting: Surgery

## 2019-01-29 DIAGNOSIS — R9431 Abnormal electrocardiogram [ECG] [EKG]: Secondary | ICD-10-CM

## 2019-01-29 DIAGNOSIS — K429 Umbilical hernia without obstruction or gangrene: Secondary | ICD-10-CM

## 2019-01-29 DIAGNOSIS — Z01818 Encounter for other preprocedural examination: Secondary | ICD-10-CM

## 2019-01-29 NOTE — H&P (Signed)
Jose [name] Documented: 01/29/2019 10:10 AM Location: [facility] Patient #: [id number] DOB: 08/06/1966 Married / Language: English / Race: Black or African American Male  History of Present Illness Jose Fus A. Betzaida Cremeens MD; 01/29/2019 10:26 AM) Patient words: Patient returns for follow-up of his umbilical hernia. He was seen 3 years ago but opted not to get repaired due to being busy at work. He now has time for repair and it is Larger. He denies any pain, nausea or vomiting.  The patient is a 53 year old male.   Allergies Jose [name], [place]; 01/29/2019 10:11 AM) No Known Drug Allergies [02/20/2016]: Allergies Reconciled  Medication History Jose [name], CMA; 01/29/2019 10:11 AM) Vitamin D (Ergocalciferol) (50000UNIT Capsule, Oral) Active. Medications Reconciled    Vitals Jose [name] CMA; 01/29/2019 10:11 AM) 01/29/2019 10:10 AM Weight: 312.6 lb Height: 70.5in Body Surface Area: 2.54 m Body Mass Index: 44.22 kg/m  Temp.: 32F  Pulse: 93 (Regular)  BP: 154/82 (Sitting, Left Arm, Standard)      Physical Exam (Ron Junco A. Jazlene Bares MD; 01/29/2019 10:26 AM)  General Mental Status-Alert. General Appearance-Consistent with stated age. Hydration-Well hydrated. Voice-Normal.  Head and Neck Head-normocephalic, atraumatic with no lesions or palpable masses.  Abdomen Note: Large reducible umbilical hernia noted. Not incarcerated. Soft nontender. Thin skin over hernia sac noted without evidence of infection or drainage.  Neurologic Neurologic evaluation reveals -alert and oriented x 3 with no impairment of recent or remote memory. Mental Status-Normal.  Musculoskeletal Normal Exam - Left-Upper Extremity Strength Normal and Lower Extremity Strength Normal. Normal Exam - Right-Upper Extremity Strength Normal, Lower Extremity Weakness.    Assessment & Plan (Tylerjames Hoglund A. Kinlee Garrison MD; 01/29/2019 10:24 AM)  UMBILICAL HERNIA WITHOUT  OBSTRUCTION AND WITHOUT GANGRENE (K42.9) Story: present for the past 3 years . No infection. Impression: Not incarcerated, very thin skin on top of reducible hernia. Minimally tender. The risk of hernia repair include bleeding, infection, organ injury, bowel injury, bladder injury, nerve injury recurrent hernia, blood clots, worsening of underlying condition, chronic pain, mesh use, open surgery, death, and the need for other operattions. Pt agrees to proceed 2-3 defect.  Current Plans You are being scheduled for surgery- Our schedulers will call you.  You should hear from our office's scheduling department within 5 working days about the location, date, and time of surgery. We try to make accommodations for patient's preferences in scheduling surgery, but sometimes the OR schedule or the surgeon's schedule prevents [place] from making those accommodations.  If you have not heard from our office [phone]) in 5 working days, call the office and ask for your surgeon's nurse.  If you have other questions about your diagnosis, plan, or surgery, call the office and ask for your surgeon's nurse.  Pt Education - Pamphlet Given - Hernia Surgery: discussed with patient and provided information. The anatomy & physiology of the abdominal wall was discussed. The pathophysiology of hernias was discussed. Natural history risks without surgery including progeressive enlargement, pain, incarceration, & strangulation was discussed. Contributors to complications such as smoking, obesity, diabetes, prior surgery, etc were discussed.  I feel the risks of no intervention will lead to serious problems that outweigh the operative risks; therefore, I recommended surgery to reduce and repair the hernia. I explained an open approach. I noted the probable use of mesh to patch and/or buttress the hernia repair  Risks such as bleeding, infection, abscess, need for further treatment, heart attack, death, and other  risks were discussed. I noted a good likelihood this will  help address the problem. Goals of post-operative recovery were discussed as well. Possibility that this will not correct all symptoms was explained. I stressed the importance of low-impact activity, aggressive pain control, avoiding constipation, & not pushing through pain to minimize risk of post-operative chronic pain or injury. Possibility of reherniation especially with smoking, obesity, diabetes, immunosuppression, and other health conditions was discussed. We will work to minimize complications.  An educational handout further explaining the pathology & treatment options was given as well. Questions were answered. The patient expresses understanding & wishes to proceed with surgery.  Pt Education - CCS Mesh education: discussed with patient and provided information.

## 2019-01-29 NOTE — H&P (View-Only) (Signed)
Jose [name] Documented: 01/29/2019 10:10 AM Location: [facility] Patient #: [id number] DOB: 04/20/1966 Married / Language: English / Race: Black or African American Male  History of Present Illness ([name] MD; 01/29/2019 10:26 AM) Patient words: Patient returns for follow-up of his umbilical hernia. He was seen 3 years ago but opted not to get repaired due to being busy at work. He now has time for repair and it is Larger. He denies any pain, nausea or vomiting.  The patient is a 53 year old male.   Allergies ([name], CMA; 01/29/2019 10:11 AM) No Known Drug Allergies [02/20/2016]: Allergies Reconciled  Medication History ([name], CMA; 01/29/2019 10:11 AM) Vitamin D (Ergocalciferol) (50000UNIT Capsule, Oral) Active. Medications Reconciled    Vitals ([name] CMA; 01/29/2019 10:11 AM) 01/29/2019 10:10 AM Weight: 312.6 lb Height: 70.5in Body Surface Area: 2.54 m Body Mass Index: 44.22 kg/m  Temp.: 98F  Pulse: 93 (Regular)  BP: 154/82 (Sitting, Left Arm, Standard)      Physical Exam ([name] MD; 01/29/2019 10:26 AM)  General Mental Status-Alert. General Appearance-Consistent with stated age. Hydration-Well hydrated. Voice-Normal.  Head and Neck Head-normocephalic, atraumatic with no lesions or palpable masses.  Abdomen Note: Large reducible umbilical hernia noted. Not incarcerated. Soft nontender. Thin skin over hernia sac noted without evidence of infection or drainage.  Neurologic Neurologic evaluation reveals -alert and oriented x 3 with no impairment of recent or remote memory. Mental Status-Normal.  Musculoskeletal Normal Exam - Left-Upper Extremity Strength Normal and Lower Extremity Strength Normal. Normal Exam - Right-Upper Extremity Strength Normal, Lower Extremity Weakness.    Assessment & Plan ([name] MD; 01/29/2019 10:24 AM)  UMBILICAL HERNIA WITHOUT  OBSTRUCTION AND WITHOUT GANGRENE (K42.9) Story: present for the past 3 years . No infection. Impression: Not incarcerated, very thin skin on top of reducible hernia. Minimally tender. The risk of hernia repair include bleeding, infection, organ injury, bowel injury, bladder injury, nerve injury recurrent hernia, blood clots, worsening of underlying condition, chronic pain, mesh use, open surgery, death, and the need for other operattions. Pt agrees to proceed 2-3 defect.  Current Plans You are being scheduled for surgery- Our schedulers will call you.  You should hear from our office's scheduling department within 5 working days about the location, date, and time of surgery. We try to make accommodations for patient's preferences in scheduling surgery, but sometimes the OR schedule or the surgeon's schedule prevents us from making those accommodations.  If you have not heard from our office ([phone]) in 5 working days, call the office and ask for your surgeon's nurse.  If you have other questions about your diagnosis, plan, or surgery, call the office and ask for your surgeon's nurse.  Pt Education - Pamphlet Given - Hernia Surgery: discussed with patient and provided information. The anatomy & physiology of the abdominal wall was discussed. The pathophysiology of hernias was discussed. Natural history risks without surgery including progeressive enlargement, pain, incarceration, & strangulation was discussed. Contributors to complications such as smoking, obesity, diabetes, prior surgery, etc were discussed.  I feel the risks of no intervention will lead to serious problems that outweigh the operative risks; therefore, I recommended surgery to reduce and repair the hernia. I explained an open approach. I noted the probable use of mesh to patch and/or buttress the hernia repair  Risks such as bleeding, infection, abscess, need for further treatment, heart attack, death, and other  risks were discussed. I noted a good likelihood this will   help address the problem. Goals of post-operative recovery were discussed as well. Possibility that this will not correct all symptoms was explained. I stressed the importance of low-impact activity, aggressive pain control, avoiding constipation, & not pushing through pain to minimize risk of post-operative chronic pain or injury. Possibility of reherniation especially with smoking, obesity, diabetes, immunosuppression, and other health conditions was discussed. We will work to minimize complications.  An educational handout further explaining the pathology & treatment options was given as well. Questions were answered. The patient expresses understanding & wishes to proceed with surgery.  Pt Education - CCS Mesh education: discussed with patient and provided information.

## 2019-02-02 ENCOUNTER — Ambulatory Visit (INDEPENDENT_AMBULATORY_CARE_PROVIDER_SITE_OTHER): Payer: No Typology Code available for payment source | Admitting: Cardiovascular Disease

## 2019-02-02 ENCOUNTER — Encounter: Payer: Self-pay | Admitting: Cardiovascular Disease

## 2019-02-02 VITALS — BP 120/62 | HR 87 | Ht 70.0 in | Wt 313.0 lb

## 2019-02-02 DIAGNOSIS — R9431 Abnormal electrocardiogram [ECG] [EKG]: Secondary | ICD-10-CM

## 2019-02-02 DIAGNOSIS — Z6841 Body Mass Index (BMI) 40.0 and over, adult: Secondary | ICD-10-CM | POA: Diagnosis not present

## 2019-02-02 NOTE — Pre-Procedure Instructions (Signed)
Jose [name]  02/02/2019      [id number] [place], [place] - [address] [place] [address] Phone: [phone] Fax: [phone]    Your procedure is scheduled on February 04, 2019.  Report to [facility] Entrance "A" at 1:00 PM.  Call this number if you have problems the morning of surgery:  [phone]   Remember:  Do not eat  after midnight tonight February 03, 2019.   You may drink clear liquids until 1200 PM.  Clear liquids allowed are:                    Water, Juice (non-citric and without pulp), Clear Tea, Black Coffee only and Gatorade    Take these medicines the morning of surgery with A SIP OF WATER -none  Beginning now, STOP taking any Aspirin (unless otherwise instructed by your surgeon), Aleve, Naproxen, Ibuprofen, Motrin, Advil, Goody's, BC's, all herbal medications, fish oil, and all vitamins     Do not wear jewelry  Do not wear lotions, powders, or colognes, or deodorant.  Men may shave face and neck.  Do not bring valuables to the hospital.  [facility] is not responsible for any belongings or valuables.  Contacts, dentures or bridgework may not be worn into surgery.  Leave your suitcase in the car.  After surgery it may be brought to your room.  For patients admitted to the hospital, discharge time will be determined by your treatment team.  Patients discharged the day of surgery will not be allowed to drive home.    [place]- Preparing For Surgery  Before surgery, you can play an important role. Because skin is not sterile, your skin needs to be as free of germs as possible. You can reduce the number of germs on your skin by washing with CHG (chlorahexidine gluconate) Soap before surgery.  CHG is an antiseptic cleaner which kills germs and bonds with the skin to continue killing germs even after washing.    Oral Hygiene is also important to reduce your risk of infection.  Remember - BRUSH YOUR TEETH THE MORNING  OF SURGERY WITH YOUR REGULAR TOOTHPASTE  Please do not use if you have an allergy to CHG or antibacterial soaps. If your skin becomes reddened/irritated stop using the CHG.  Do not shave (including legs and underarms) for at least 48 hours prior to first CHG shower. It is OK to shave your face.  Please follow these instructions carefully.   1. Shower the NIGHT BEFORE SURGERY and the MORNING OF SURGERY with CHG.   2. If you chose to wash your hair, wash your hair first as usual with your normal shampoo.  3. After you shampoo, rinse your hair and body thoroughly to remove the shampoo.  4. Use CHG as you would any other liquid soap. You can apply CHG directly to the skin and wash gently with a scrungie or a clean washcloth.   5. Apply the CHG Soap to your body ONLY FROM THE NECK DOWN.  Do not use on open wounds or open sores. Avoid contact with your eyes, ears, mouth and genitals (private parts). Wash Face and genitals (private parts)  with your normal soap.  6. Wash thoroughly, paying special attention to the area where your surgery will be performed.  7. Thoroughly rinse your body with warm water from the neck down.  8. DO NOT shower/wash with your normal soap after using and rinsing off the  CHG Soap.  9. Pat yourself dry with a CLEAN TOWEL.  10. Wear CLEAN PAJAMAS to bed the night before surgery, wear comfortable clothes the morning of surgery  11. Place CLEAN SHEETS on your bed the night of your first shower and DO NOT SLEEP WITH PETS.  Day of Surgery:  Do not apply any deodorants/lotions.  Please wear clean clothes to the hospital/surgery center.   Remember to brush your teeth WITH YOUR REGULAR TOOTHPASTE.  Please read over the following fact sheets that you were given. Pain Booklet, Coughing and Deep Breathing and Surgical Site Infection Prevention

## 2019-02-02 NOTE — Progress Notes (Signed)
Cardiology Office Note:    Date:  02/02/2019   ID:  Jose [name], DOB Sep 14, 1966, MRN [id number]  PCP:  Jac Canavanysinger, David S, PA-C  Cardiologist:  Kristeen MissPhilip , MD  Electrophysiologist:  None   Referring MD: Jac Canavanysinger, David S, PA-C   Problem List  1. Morbid obesity 2. Abnormal ECG  - poor R wave progression   Chief Complaint  Patient presents with  . Abnormal ECG        Jose SabalRodney D [name] is a 53 y.o. male with a hx of obesity. He was recently seen for a preoperative visi for hernia surgery t by his primary medical doctor and was noted to have an abnormal EKG.  He has poor R wave progression. We are asked to see him today by Crosby Oysteravid tysinger, PA   No CP ,  Little bit of exercise.  Walks before work , 4 times a week .   3/4 mile [employer] homestyle food.   No dyspnea .   Smokes 1 ppd    Past Medical History:  Diagnosis Date  . Carpal tunnel syndrome 09/26/2015   Bilateral  . Chronic pain of both knees 01/18/2019  . Ganglion cyst of wrist, right 01/18/2019  . Low libido   . Obesity   . Pain in both wrists 01/18/2019  . Sickle cell trait (HCC)   . Smoker   . Umbilical hernia without obstruction and without gangrene 01/18/2019  . Wrist pain   . Wrist swelling, unspecified laterality 01/18/2019    History reviewed. No pertinent surgical history.  Current Medications: Current Meds  Medication Sig  . ibuprofen (ADVIL,MOTRIN) 200 MG tablet Take 600 mg by mouth every 6 (six) hours as needed for moderate pain.     Allergies:   Patient has no known allergies.   Social History   Socioeconomic History  . Marital status: Married    Spouse name: Not on file  . Number of children: Not on file  . Years of education: Not on file  . Highest education level: Not on file  Occupational History  . Not on file  Social Needs  . Financial resource strain: Not on file  . Food insecurity:    Worry: Not on file    Inability: Not on file  . Transportation needs:   Medical: Not on file    Non-medical: Not on file  Tobacco Use  . Smoking status: Current Every Day Smoker    Packs/day: 1.00    Years: 0.00    Pack years: 0.00  . Smokeless tobacco: Current User    Types: Chew  Substance and Sexual Activity  . Alcohol use: Not on file  . Drug use: Not on file  . Sexual activity: Not on file  Lifestyle  . Physical activity:    Days per week: Not on file    Minutes per session: Not on file  . Stress: Not on file  Relationships  . Social connections:    Talks on phone: Not on file    Gets together: Not on file    Attends religious service: Not on file    Active member of club or organization: Not on file    Attends meetings of clubs or organizations: Not on file    Relationship status: Not on file  Other Topics Concern  . 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  ROS:   Please see the history of present illness.     All other systems reviewed and are negative.  EKGs/Labs/Other Studies Reviewed:        Recent Labs: 01/18/2019: ALT 36; BUN 10; Creatinine, Ser 0.79; Hemoglobin 15.0; Platelets 240; Potassium 4.5; Sodium 139  Recent Lipid Panel No results found for: CHOL, TRIG, HDL, CHOLHDL, VLDL, LDLCALC, LDLDIRECT  Physical Exam:    VS:  BP 120/62   Pulse 87   Ht 5\' 10"  (1.778 m)   Wt (!) 313 lb (142 kg)   SpO2 96%   BMI 44.91 kg/m     Wt Readings from Last 3 Encounters:  02/02/19 (!) 313 lb (142 kg)  01/18/19 (!) 310 lb 6.4 oz (140.8 kg)  03/16/16 (!) 312 lb (141.5 kg)     GEN:  Middle age, morbidly obese male,  NAD  HEENT: Normal NECK: No JVD; No carotid bruits LYMPHATICS: No lymphadenopathy CARDIAC: RRR, no murmurs, rubs, gallops RESPIRATORY:  Clear to auscultation without rales, wheezing or rhonchi  ABDOMEN: Soft, non-tender, non-distended,  umbilical hernia  MUSCULOSKELETAL:  No edema; No deformity  SKIN: Warm and dry NEUROLOGIC:  Alert and oriented x 3 PSYCHIATRIC:  Normal affect   EKG:   February 02, 2019: Normal sinus rhythm.  Nonspecific ST and T wave changes.  ASSESSMENT:    1. Abnormal ECG   2. Morbid obesity with BMI of 40.0-44.9, adult (HCC)    PLAN:    In order of problems listed above:  1. Preoperative evaluation prior to umbilical hernia repair:   His original preoperative EKG revealed poor R wave progression.  I suspect this was due to lead placement.  Repeat EKG here today reveals slightly better R wave progression.  I suspect his heart is rotated to the left because of his morbid obesity.  Is not had any signs or symptoms of a myocardial infarction.  He walks three quarters of a mile several days a week without difficulty.  That he is at low risk for his upcoming hernia repair. May see [place] on an as-needed basis.  Medication Adjustments/Labs and Tests Ordered: Current medicines are reviewed at length with the patient today.  Concerns regarding medicines are outlined above.  Orders Placed This Encounter  Procedures  . EKG 12-Lead   No orders of the defined types were placed in this encounter.   Patient Instructions  Medication Instructions:  Your physician recommends that you continue on your current medications as directed. Please refer to the Current Medication list given to you today.  If you need a refill on your cardiac medications before your next appointment, please call your pharmacy.   Lab work: None Ordered  If you have labs (blood work) drawn today and your tests are completely normal, you will receive your results only by: Marland Kitchen MyChart Message (if you have MyChart) OR . A paper copy in the mail If you have any lab test that is abnormal or we need to change your treatment, we will call you to review the results.   Testing/Procedures: None Ordered   Follow-Up: At [facility],  you and your health needs are our priority.  As part of our continuing mission to provide you with exceptional heart care, we have created designated Provider Care Teams.  These Care Teams include your primary Cardiologist (physician) and Advanced Practice Providers (APPs -  Physician Assistants and Nurse Practitioners) who all work together to provide  you with the care you need, when you need it. You will need a follow up appointment in:   As needed.  Please call our office 2 months in advance to schedule this appointment.  You may see Dr. Elease Hashimoto or one of the following Advanced Practice Providers on your designated Care Team: Tereso Newcomer, PA-C [place], [place] . Berton Bon, NP       Signed, Kristeen Miss, MD  02/02/2019 5:16 PM    [facility]

## 2019-02-02 NOTE — Patient Instructions (Signed)
Medication Instructions:  Your physician recommends that you continue on your current medications as directed. Please refer to the Current Medication list given to you today.  If you need a refill on your cardiac medications before your next appointment, please call your pharmacy.   Lab work: None Ordered  If you have labs (blood work) drawn today and your tests are completely normal, you will receive your results only by: Marland Kitchen MyChart Message (if you have MyChart) OR . A paper copy in the mail If you have any lab test that is abnormal or we need to change your treatment, we will call you to review the results.   Testing/Procedures: None Ordered   Follow-Up: At [facility], you and your health needs are our priority.  As part of our continuing mission to provide you with exceptional heart care, we have created designated Provider Care Teams.  These Care Teams include your primary Cardiologist (physician) and Advanced Practice Providers (APPs -  Physician Assistants and Nurse Practitioners) who all work together to provide you with the care you need, when you need it. You will need a follow up appointment in:   As needed.  Please call our office 2 months in advance to schedule this appointment.  You may see Dr. Elease Hashimoto or one of the following Advanced Practice Providers on your designated Care Team: Tereso Newcomer, PA-C [place], [place] . Berton Bon, NP

## 2019-02-03 ENCOUNTER — Other Ambulatory Visit: Payer: Self-pay

## 2019-02-03 ENCOUNTER — Encounter (HOSPITAL_COMMUNITY)
Admission: RE | Admit: 2019-02-03 | Discharge: 2019-02-03 | Disposition: A | Discharge status: Home / Self Care | Payer: No Typology Code available for payment source | Source: Ambulatory Visit | Attending: Surgery | Admitting: Surgery

## 2019-02-03 ENCOUNTER — Encounter (HOSPITAL_COMMUNITY): Payer: Self-pay

## 2019-02-03 DIAGNOSIS — F17200 Nicotine dependence, unspecified, uncomplicated: Secondary | ICD-10-CM | POA: Diagnosis not present

## 2019-02-03 DIAGNOSIS — Z6841 Body Mass Index (BMI) 40.0 and over, adult: Secondary | ICD-10-CM | POA: Diagnosis not present

## 2019-02-03 DIAGNOSIS — K429 Umbilical hernia without obstruction or gangrene: Secondary | ICD-10-CM | POA: Diagnosis present

## 2019-02-03 DIAGNOSIS — Z01812 Encounter for preprocedural laboratory examination: Secondary | ICD-10-CM

## 2019-02-03 HISTORY — DX: Unspecified osteoarthritis, unspecified site: M19.90

## 2019-02-03 LAB — CBC WITH DIFFERENTIAL/PLATELET
Abs Immature Granulocytes: 0.01 10*3/uL (ref 0.00–0.07)
BASOS PCT: 1 %
Basophils Absolute: 0 10*3/uL (ref 0.0–0.1)
EOS PCT: 2 %
Eosinophils Absolute: 0.1 10*3/uL (ref 0.0–0.5)
HCT: 45.2 % (ref 39.0–52.0)
Hemoglobin: 15.4 g/dL (ref 13.0–17.0)
Immature Granulocytes: 0 %
Lymphocytes Relative: 34 %
Lymphs Abs: 2 10*3/uL (ref 0.7–4.0)
MCH: 32.8 pg (ref 26.0–34.0)
MCHC: 34.1 g/dL (ref 30.0–36.0)
MCV: 96.2 fL (ref 80.0–100.0)
MONO ABS: 0.5 10*3/uL (ref 0.1–1.0)
Monocytes Relative: 9 %
Neutro Abs: 3.2 10*3/uL (ref 1.7–7.7)
Neutrophils Relative %: 54 %
Platelets: 238 10*3/uL (ref 150–400)
RBC: 4.7 MIL/uL (ref 4.22–5.81)
RDW: 11 % — ABNORMAL LOW (ref 11.5–15.5)
WBC: 5.8 10*3/uL (ref 4.0–10.5)
nRBC: 0 % (ref 0.0–0.2)

## 2019-02-03 LAB — COMPREHENSIVE METABOLIC PANEL
ALT: 39 U/L (ref 0–44)
AST: 31 U/L (ref 15–41)
Albumin: 3.9 g/dL (ref 3.5–5.0)
Alkaline Phosphatase: 61 U/L (ref 38–126)
Anion gap: 7 (ref 5–15)
BUN: 11 mg/dL (ref 6–20)
CO2: 24 mmol/L (ref 22–32)
CREATININE: 0.79 mg/dL (ref 0.61–1.24)
Calcium: 9.4 mg/dL (ref 8.9–10.3)
Chloride: 107 mmol/L (ref 98–111)
GFR calc non Af Amer: >60 mL/min (ref >60)
Glucose, Bld: 127 mg/dL — ABNORMAL HIGH (ref 70–99)
Potassium: 4 mmol/L (ref 3.5–5.1)
Sodium: 138 mmol/L (ref 135–145)
Total Bilirubin: 0.9 mg/dL (ref 0.3–1.2)
Total Protein: 7.5 g/dL (ref 6.5–8.1)

## 2019-02-03 MED ORDER — DEXTROSE 5 % IV SOLN
3.0000 g | INTRAVENOUS | Status: AC
  Administered 2019-02-04: 3 g via INTRAVENOUS
  Filled 2019-02-03: qty 3

## 2019-02-03 NOTE — Anesthesia Preprocedure Evaluation (Addendum)
Anesthesia Evaluation  Patient identified by MRN, date of birth, ID band Patient awake    Reviewed: Allergy & Precautions, NPO status , Patient's Chart, lab work & pertinent test results  Airway Mallampati: II  TM Distance: <3 FB Neck ROM: Full    Dental no notable dental hx.    Pulmonary Current Smoker,    breath sounds clear to auscultation + decreased breath sounds      Cardiovascular negative cardio ROS Normal cardiovascular exam Rhythm:Regular Rate:Normal     Neuro/Psych negative neurological ROS  negative psych ROS   GI/Hepatic negative GI ROS, Neg liver ROS,   Endo/Other  Morbid obesity  Renal/GU negative Renal ROS  negative genitourinary   Musculoskeletal negative musculoskeletal ROS (+)   Abdominal (+) + obese,   Peds negative pediatric ROS (+)  Hematology negative hematology ROS (+)   Anesthesia Other Findings   Reproductive/Obstetrics negative OB ROS                            Anesthesia Physical Anesthesia Plan  ASA: III  Anesthesia Plan: General   Post-op Pain Management:    Induction: Intravenous  PONV Risk Score and Plan: 2 and Ondansetron, Treatment may vary due to age or medical condition and Dexamethasone  Airway Management Planned: Oral ETT  Additional Equipment:   Intra-op Plan:   Post-operative Plan: Extubation in OR  Informed Consent: I have reviewed the patients History and Physical, chart, labs and discussed the procedure including the risks, benefits and alternatives for the proposed anesthesia with the patient or authorized representative who has indicated his/her understanding and acceptance.     Dental advisory given  Plan Discussed with: CRNA and Surgeon  Anesthesia Plan Comments: (Seen by Dr. Elease Hashimoto for clearance in setting of abn EKG. Per note 02/02/19: "His original preoperative EKG revealed poor R wave progression.  I suspect this was due  to lead placement.  Repeat EKG here today reveals slightly better R wave progression.  I suspect his heart is rotated to the left because of his morbid obesity. Is not had any signs or symptoms of a myocardial infarction.  He walks three quarters of a mile several days a week without difficulty. That he is at low risk for his upcoming hernia repair. May see [place] on an as-needed basis." )       Anesthesia Quick Evaluation

## 2019-02-03 NOTE — Progress Notes (Signed)
PCP -Tysinger, PA  Cardiologist - Nahser  Chest x-ray - 2017 EKG - 2020 Stress Test - n/a ECHO -n/a  Cardiac Cath - n/a  Sleep Study - n/a CPAP - n/a  Fasting Blood Sugar - n/a Checks Blood Sugar _____ times a day  Blood Thinner Instructions:n/a Aspirin Instructions:n/a  Anesthesia review: yes, seen by Dr. Elease Hashimoto for surgical clearance due to prior EKG.   Patient denies shortness of breath, fever, cough and chest pain at PAT appointment   Patient verbalized understanding of instructions that were given to them at the PAT appointment. Patient was also instructed that they will need to review over the PAT instructions again at home before surgery.

## 2019-02-04 ENCOUNTER — Ambulatory Visit (HOSPITAL_COMMUNITY): Payer: No Typology Code available for payment source | Admitting: Physician Assistant

## 2019-02-04 ENCOUNTER — Other Ambulatory Visit: Payer: Self-pay

## 2019-02-04 ENCOUNTER — Ambulatory Visit (HOSPITAL_COMMUNITY)
Admission: RE | Admit: 2019-02-04 | Discharge: 2019-02-04 | Disposition: A | Discharge status: Home / Self Care | Payer: No Typology Code available for payment source | Attending: Surgery | Admitting: Surgery

## 2019-02-04 ENCOUNTER — Encounter (HOSPITAL_COMMUNITY): Payer: Self-pay | Admitting: Anesthesiology

## 2019-02-04 ENCOUNTER — Ambulatory Visit (HOSPITAL_COMMUNITY): Payer: No Typology Code available for payment source | Admitting: Anesthesiology

## 2019-02-04 ENCOUNTER — Encounter (HOSPITAL_COMMUNITY)
Admission: RE | Disposition: A | Discharge status: Home / Self Care | Payer: Self-pay | Source: Home / Self Care | Attending: Surgery

## 2019-02-04 DIAGNOSIS — F17200 Nicotine dependence, unspecified, uncomplicated: Secondary | ICD-10-CM | POA: Insufficient documentation

## 2019-02-04 DIAGNOSIS — Z6841 Body Mass Index (BMI) 40.0 and over, adult: Secondary | ICD-10-CM | POA: Insufficient documentation

## 2019-02-04 DIAGNOSIS — K429 Umbilical hernia without obstruction or gangrene: Secondary | ICD-10-CM | POA: Insufficient documentation

## 2019-02-04 HISTORY — DX: Umbilical hernia without obstruction or gangrene: K42.9

## 2019-02-04 HISTORY — PX: UMBILICAL HERNIA REPAIR: SHX196

## 2019-02-04 SURGERY — REPAIR, HERNIA, UMBILICAL, ADULT
Anesthesia: General | Site: Abdomen

## 2019-02-04 MED ORDER — ONDANSETRON HCL 4 MG/2ML IJ SOLN
INTRAMUSCULAR | Status: DC | PRN
  Administered 2019-02-04: 4 mg via INTRAVENOUS

## 2019-02-04 MED ORDER — ROCURONIUM BROMIDE 50 MG/5ML IV SOSY
PREFILLED_SYRINGE | INTRAVENOUS | Status: AC
  Filled 2019-02-04: qty 5

## 2019-02-04 MED ORDER — LABETALOL HCL 5 MG/ML IV SOLN
INTRAVENOUS | Status: DC | PRN
  Administered 2019-02-04: 10 mg via INTRAVENOUS

## 2019-02-04 MED ORDER — SUGAMMADEX SODIUM 200 MG/2ML IV SOLN
INTRAVENOUS | Status: DC | PRN
  Administered 2019-02-04: 400 mg via INTRAVENOUS

## 2019-02-04 MED ORDER — ROCURONIUM BROMIDE 10 MG/ML (PF) SYRINGE
PREFILLED_SYRINGE | INTRAVENOUS | Status: DC | PRN
  Administered 2019-02-04: 50 mg via INTRAVENOUS
  Administered 2019-02-04: 20 mg via INTRAVENOUS

## 2019-02-04 MED ORDER — CHLORHEXIDINE GLUCONATE CLOTH 2 % EX PADS: 6.0000 | MEDICATED_PAD | Freq: Once | CUTANEOUS | Status: DC

## 2019-02-04 MED ORDER — LIDOCAINE 2% (20 MG/ML) 5 ML SYRINGE
INTRAMUSCULAR | Status: DC | PRN
  Administered 2019-02-04: 100 mg via INTRAVENOUS

## 2019-02-04 MED ORDER — FENTANYL CITRATE (PF) 250 MCG/5ML IJ SOLN
INTRAMUSCULAR | Status: AC
  Filled 2019-02-04: qty 5

## 2019-02-04 MED ORDER — FENTANYL CITRATE (PF) 100 MCG/2ML IJ SOLN
25.0000 ug | INTRAMUSCULAR | Status: DC | PRN
  Administered 2019-02-04: 25 ug via INTRAVENOUS
  Administered 2019-02-04: 50 ug via INTRAVENOUS
  Administered 2019-02-04: 25 ug via INTRAVENOUS

## 2019-02-04 MED ORDER — LABETALOL HCL 5 MG/ML IV SOLN
INTRAVENOUS | Status: AC
  Filled 2019-02-04: qty 4

## 2019-02-04 MED ORDER — GABAPENTIN 300 MG PO CAPS
ORAL_CAPSULE | ORAL | Status: AC
  Filled 2019-02-04: qty 1

## 2019-02-04 MED ORDER — MIDAZOLAM HCL 2 MG/2ML IJ SOLN
INTRAMUSCULAR | Status: AC
  Filled 2019-02-04: qty 2

## 2019-02-04 MED ORDER — FENTANYL CITRATE (PF) 100 MCG/2ML IJ SOLN
INTRAMUSCULAR | Status: AC
  Filled 2019-02-04: qty 2

## 2019-02-04 MED ORDER — LIDOCAINE 2% (20 MG/ML) 5 ML SYRINGE
INTRAMUSCULAR | Status: AC
  Filled 2019-02-04: qty 5

## 2019-02-04 MED ORDER — KETOROLAC TROMETHAMINE 30 MG/ML IJ SOLN
INTRAMUSCULAR | Status: AC
  Filled 2019-02-04: qty 1

## 2019-02-04 MED ORDER — FENTANYL CITRATE (PF) 250 MCG/5ML IJ SOLN
INTRAMUSCULAR | Status: DC | PRN
  Administered 2019-02-04 (×2): 100 ug via INTRAVENOUS
  Administered 2019-02-04: 50 ug via INTRAVENOUS

## 2019-02-04 MED ORDER — CELECOXIB 200 MG PO CAPS
ORAL_CAPSULE | ORAL | Status: AC
  Filled 2019-02-04: qty 1

## 2019-02-04 MED ORDER — BUPIVACAINE HCL (PF) 0.25 % IJ SOLN
INTRAMUSCULAR | Status: DC | PRN
  Administered 2019-02-04: 10 mL

## 2019-02-04 MED ORDER — ACETAMINOPHEN 500 MG PO TABS
1000.0000 mg | ORAL_TABLET | ORAL | Status: AC
  Administered 2019-02-04: 1000 mg via ORAL

## 2019-02-04 MED ORDER — GABAPENTIN 300 MG PO CAPS
300.0000 mg | ORAL_CAPSULE | ORAL | Status: AC
  Administered 2019-02-04: 300 mg via ORAL

## 2019-02-04 MED ORDER — CELECOXIB 200 MG PO CAPS
200.0000 mg | ORAL_CAPSULE | ORAL | Status: AC
  Administered 2019-02-04: 200 mg via ORAL

## 2019-02-04 MED ORDER — MIDAZOLAM HCL 5 MG/5ML IJ SOLN
INTRAMUSCULAR | Status: DC | PRN
  Administered 2019-02-04: 2 mg via INTRAVENOUS

## 2019-02-04 MED ORDER — PROMETHAZINE HCL 25 MG/ML IJ SOLN: 6.2500 mg | INTRAMUSCULAR | Status: DC | PRN

## 2019-02-04 MED ORDER — KETOROLAC TROMETHAMINE 30 MG/ML IJ SOLN
30.0000 mg | Freq: Once | INTRAMUSCULAR | Status: AC | PRN
  Administered 2019-02-04: 30 mg via INTRAVENOUS

## 2019-02-04 MED ORDER — HYDROMORPHONE HCL 1 MG/ML IJ SOLN
INTRAMUSCULAR | Status: AC
  Filled 2019-02-04: qty 1

## 2019-02-04 MED ORDER — OXYCODONE HCL 5 MG PO TABS: 5.0000 mg | ORAL_TABLET | Freq: Four times a day (QID) | ORAL | 0 refills | Status: DC | PRN

## 2019-02-04 MED ORDER — IBUPROFEN 800 MG PO TABS: 800.0000 mg | ORAL_TABLET | Freq: Three times a day (TID) | ORAL | 0 refills | Status: DC | PRN

## 2019-02-04 MED ORDER — DEXAMETHASONE SODIUM PHOSPHATE 10 MG/ML IJ SOLN
INTRAMUSCULAR | Status: DC | PRN
  Administered 2019-02-04: 8 mg via INTRAVENOUS

## 2019-02-04 MED ORDER — ACETAMINOPHEN 500 MG PO TABS
ORAL_TABLET | ORAL | Status: AC
  Filled 2019-02-04: qty 2

## 2019-02-04 MED ORDER — 0.9 % SODIUM CHLORIDE (POUR BTL) OPTIME
TOPICAL | Status: DC | PRN
  Administered 2019-02-04: 1000 mL

## 2019-02-04 MED ORDER — SUGAMMADEX SODIUM 500 MG/5ML IV SOLN
INTRAVENOUS | Status: AC
  Filled 2019-02-04: qty 5

## 2019-02-04 MED ORDER — BUPIVACAINE HCL (PF) 0.25 % IJ SOLN
INTRAMUSCULAR | Status: AC
  Filled 2019-02-04: qty 30

## 2019-02-04 MED ORDER — LACTATED RINGERS IV SOLN
INTRAVENOUS | Status: DC
  Administered 2019-02-04 (×2): via INTRAVENOUS

## 2019-02-04 MED ORDER — HYDROMORPHONE HCL 1 MG/ML IJ SOLN
0.2500 mg | INTRAMUSCULAR | Status: DC | PRN
  Administered 2019-02-04 (×2): 0.25 mg via INTRAVENOUS

## 2019-02-04 MED ORDER — SUCCINYLCHOLINE CHLORIDE 20 MG/ML IJ SOLN
INTRAMUSCULAR | Status: DC | PRN
  Administered 2019-02-04: 120 mg via INTRAVENOUS

## 2019-02-04 MED ORDER — PROPOFOL 10 MG/ML IV BOLUS
INTRAVENOUS | Status: DC | PRN
  Administered 2019-02-04: 200 mg via INTRAVENOUS

## 2019-02-04 SURGICAL SUPPLY — 34 items
BINDER ABDOMINAL 12 ML 46-62 (SOFTGOODS) ×2 IMPLANT
CANISTER SUCT 3000ML PPV (MISCELLANEOUS) IMPLANT
CHLORAPREP W/TINT 26ML (MISCELLANEOUS) ×2 IMPLANT
COVER SURGICAL LIGHT HANDLE (MISCELLANEOUS) ×2 IMPLANT
COVER WAND RF STERILE (DRAPES) ×2 IMPLANT
DERMABOND ADVANCED (GAUZE/BANDAGES/DRESSINGS) ×1
DERMABOND ADVANCED .7 DNX12 (GAUZE/BANDAGES/DRESSINGS) ×1 IMPLANT
DRAPE LAPAROTOMY 100X72 PEDS (DRAPES) ×2 IMPLANT
ELECT REM PT RETURN 9FT ADLT (ELECTROSURGICAL) ×2
ELECTRODE REM PT RTRN 9FT ADLT (ELECTROSURGICAL) ×1 IMPLANT
GAUZE SPONGE 4X4 12PLY STRL (GAUZE/BANDAGES/DRESSINGS) IMPLANT
GAUZE SPONGE 4X4 12PLY STRL LF (GAUZE/BANDAGES/DRESSINGS) ×2 IMPLANT
GLOVE BIO SURGEON STRL SZ8 (GLOVE) ×2 IMPLANT
GLOVE BIOGEL PI IND STRL 8 (GLOVE) ×1 IMPLANT
GLOVE BIOGEL PI INDICATOR 8 (GLOVE) ×1
GOWN STRL REUS W/ TWL LRG LVL3 (GOWN DISPOSABLE) ×1 IMPLANT
GOWN STRL REUS W/ TWL XL LVL3 (GOWN DISPOSABLE) ×1 IMPLANT
GOWN STRL REUS W/TWL LRG LVL3 (GOWN DISPOSABLE) ×1
GOWN STRL REUS W/TWL XL LVL3 (GOWN DISPOSABLE) ×1
KIT BASIN OR (CUSTOM PROCEDURE TRAY) ×2 IMPLANT
KIT TURNOVER KIT B (KITS) ×2 IMPLANT
MESH VENTRALEX ST 8CM LRG (Mesh General) ×2 IMPLANT
NEEDLE HYPO 25GX1X1/2 BEV (NEEDLE) ×2 IMPLANT
NS IRRIG 1000ML POUR BTL (IV SOLUTION) ×2 IMPLANT
PACK GENERAL/GYN (CUSTOM PROCEDURE TRAY) ×2 IMPLANT
PAD ARMBOARD 7.5X6 YLW CONV (MISCELLANEOUS) ×2 IMPLANT
PENCIL SMOKE EVACUATOR (MISCELLANEOUS) ×2 IMPLANT
SUT MON AB 4-0 PC3 18 (SUTURE) ×2 IMPLANT
SUT NOVA NAB DX-16 0-1 5-0 T12 (SUTURE) ×6 IMPLANT
SUT VIC AB 3-0 SH 27 (SUTURE) ×2
SUT VIC AB 3-0 SH 27X BRD (SUTURE) ×2 IMPLANT
SYR CONTROL 10ML LL (SYRINGE) ×2 IMPLANT
TOWEL OR 17X24 6PK STRL BLUE (TOWEL DISPOSABLE) ×2 IMPLANT
TOWEL OR 17X26 10 PK STRL BLUE (TOWEL DISPOSABLE) ×2 IMPLANT

## 2019-02-04 NOTE — Interval H&P Note (Signed)
History and Physical Interval Note:  02/04/2019 1:58 PM  Jose [name]  has presented today for surgery, with the diagnosis of UMBILICAL HERNIA  The various methods of treatment have been discussed with the patient and family. After consideration of risks, benefits and other options for treatment, the patient has consented to  Procedure(s): UMBILICAL HERNIA REPAIR WITH MESH (N/A) as a surgical intervention .  The patient's history has been reviewed, patient examined, no change in status, stable for surgery.  I have reviewed the patient's chart and labs.  Questions were answered to the patient's satisfaction.     Vendela Troung A Murice Barbar

## 2019-02-04 NOTE — Discharge Instructions (Signed)
[facility] _______[facility] ° °UMBILICAL OR INGUINAL HERNIA REPAIR: POST OP INSTRUCTIONS ° °Always review your discharge instruction sheet given to you by the facility where your surgery was performed. °IF YOU HAVE DISABILITY OR FAMILY LEAVE FORMS, YOU MUST BRING THEM TO THE OFFICE FOR PROCESSING.   °DO NOT GIVE THEM TO YOUR DOCTOR. ° °1. A  prescription for pain medication may be given to you upon discharge.  Take your pain medication as prescribed, if needed.  If narcotic pain medicine is not needed, then you may take acetaminophen (Tylenol) or ibuprofen (Advil) as needed. °2. Take your usually prescribed medications unless otherwise directed. °If you need a refill on your pain medication, please contact your pharmacy.  They will contact our office to request authorization. Prescriptions will not be filled after 5 pm or on week-ends. °3. You should follow a light diet the first 24 hours after arrival home, such as soup and crackers, etc.  Be sure to include lots of fluids daily.  Resume your normal diet the day after surgery. °4.Most patients will experience some swelling and bruising around the umbilicus or in the groin and scrotum.  Ice packs and reclining will help.  Swelling and bruising can take several days to resolve.  °6. It is common to experience some constipation if taking pain medication after surgery.  Increasing fluid intake and taking a stool softener (such as Colace) will usually help or prevent this problem from occurring.  A mild laxative (Milk of Magnesia or Miralax) should be taken according to package directions if there are no bowel movements after 48 hours. °7. Unless discharge instructions indicate otherwise, you may remove your bandages 24-48 hours after surgery, and you may shower at that time.  You may have steri-strips (small skin tapes) in place directly over the incision.  These strips should be left on the skin for 7-10 days.  If your surgeon used skin glue on the  incision, you may shower in 24 hours.  The glue will flake off over the next 2-3 weeks.  Any sutures or staples will be removed at the office during your follow-up visit. °8. ACTIVITIES:  You may resume regular (light) daily activities beginning the next day--such as daily self-care, walking, climbing stairs--gradually increasing activities as tolerated.  You may have sexual intercourse when it is comfortable.  Refrain from any heavy lifting or straining until approved by your doctor. ° °a.You may drive when you are no longer taking prescription pain medication, you can comfortably wear a seatbelt, and you can safely maneuver your car and apply brakes. °b.RETURN TO WORK:   °_____________________________________________ ° °9.You should see your doctor in the office for a follow-up appointment approximately 2-3 weeks after your surgery.  Make sure that you call for this appointment within a day or two after you arrive home to insure a convenient appointment time. °10.OTHER INSTRUCTIONS: _________________________ °   _____________________________________ ° °WHEN TO CALL YOUR DOCTOR: °1. Fever over 101.0 °2. Inability to urinate °3. Nausea and/or vomiting °4. Extreme swelling or bruising °5. Continued bleeding from incision. °6. Increased pain, redness, or drainage from the incision ° °The clinic staff is available to answer your questions during regular business hours.  Please don’t hesitate to call and ask to speak to one of the nurses for clinical concerns.  If you have a medical emergency, go to the nearest emergency room or call 911.  A surgeon from [facility] is always on call at the hospital ° ° °  [address], [address], [place], [place]  [address] ? ° [address] [address], [place], [place]   [address] °[phone] ? [phone] ? FAX [phone] °Web site: [email or web address] °

## 2019-02-04 NOTE — Transfer of Care (Signed)
Immediate Anesthesia Transfer of Care Note  Patient: Jose [name]  Procedure(s) Performed: UMBILICAL HERNIA REPAIR WITH MESH (N/A Abdomen)  Patient Location: PACU  Anesthesia Type:General  Level of Consciousness: awake, alert  and oriented  Airway & Oxygen Therapy: Patient Spontanous Breathing and Patient connected to nasal cannula oxygen  Post-op Assessment: Report given to RN, Post -op Vital signs reviewed and stable and Patient moving all extremities X 4  Post vital signs: Reviewed and stable  Last Vitals:  Vitals Value Taken Time  BP 154/87 02/04/2019  3:34 PM  Temp    Pulse 80 02/04/2019  3:36 PM  Resp 22 02/04/2019  3:36 PM  SpO2 89 % 02/04/2019  3:36 PM  Vitals shown include unvalidated device data.  Last Pain:  Vitals:   02/04/19 1313  TempSrc:   PainSc: 0-No pain         Complications: No apparent anesthesia complications

## 2019-02-04 NOTE — Op Note (Signed)
RAMSEY BJERK Jan 30, 1966 [id number] 02/04/2019  Preoperative diagnosis: Large reducible umbilical hernia  Postoperative diagnosis: Same  Procedure: Umbilical Hernia Repair with mesh  Surgeon: Dortha Schwalbe, MD, FACS  Anesthesia: General and Local    Clinical History and Indications: Patient presents for repair of a large longstanding umbilical hernia.  Reducible but getting larger and causing discomfort with the patient he opted for repair with mesh.  Options of repair including laparoscopic approaches with mesh were discussed.  Open repair with mesh discussed.  The use of mesh and long-term implications potential complications of mesh discussed with the patient as well.The risk of hernia repair include bleeding,  Infection,   Recurrence of the hernia,  Mesh use, chronic pain,  Organ injury,  Bowel injury,  Bladder injury,   nerve injury with numbness around the incision,  Death,  and worsening of preexisting  medical problems.  The alternatives to surgery have been discussed as well..  Long term expectations of both operative and non operative treatments have been discussed.   The patient agrees to proceed.  Procedure: The patient was seen in the preoperative area and the plans for the procedure reviewed again. He  had no further questions. I marked the area of the umbilicus as the operative site. He wishes to prodeed.  The patient was taken to the operating room and after satisfactory general anesthesia had been obtained the area was clipped as needed, prepped and draped. The timeout was performed.  I used some 0.25% Sensorcaine anesthesia to help with postoperative pain management. This was infiltrated around the umbilical area and additional infiltrated as I worked.  A curvilinear incision was made on the inferior aspect of the umbilicus. The umbilical skin was elevated off of the hernia sac. The hernia sac was dissected free of the subcutaneous tissues.  There is some omentum adherent  to the hernia sac.  I dissected all the soft reduced back into the abdominal cavity without difficulty.  Hemostasis achieved.  Hernia defect was defined there is about 4 cm in maximal diameter.  I used an 8 cm ventral light coated umbilical hernia mesh and placed it in a submuscular position.  This was secured circumferentially with #1 Novafil.  There are no gaps in the mesh to allow for any bowel to sleep in.  It was well secured to the undersurface of the abdominal wall musculature.  I did closed the fascia over this with #1 Novafil.  He had significant excess of umbilical skin this was trimmed and passed off the field.  I refashioned his umbilicus with 2-0 Vicryl.  3-0 Vicryl was used to close the subcutaneous space.  4 Monocryl was used to close the skin in a subcuticular fashion.  Dermabond was applied.  Abdominal binder placed.  All final counts found to be correct.  Patient was awoke extubated taken to recovery in satisfactory condition.   The patient tolerated the procedure well. There were no operative complications. There was minimal blood loss. All counts were correct. He was taken to the PACU in satisfactory condition.  Dortha Schwalbe, MD, FACS 02/04/2019 3:27 PM

## 2019-02-04 NOTE — Anesthesia Procedure Notes (Signed)
Procedure Name: Intubation Date/Time: 02/04/2019 2:13 PM Performed by: Marena Chancy, CRNA Pre-anesthesia Checklist: Patient identified, Emergency Drugs available, Suction available and Patient being monitored Patient Re-evaluated:Patient Re-evaluated prior to induction Oxygen Delivery Method: Circle System Utilized Preoxygenation: Pre-oxygenation with 100% oxygen Induction Type: IV induction Ventilation: Mask ventilation without difficulty Laryngoscope Size: Miller and 3 Grade View: Grade II Tube type: Oral Tube size: 8.0 mm Number of attempts: 1 Airway Equipment and Method: Stylet and Oral airway Placement Confirmation: ETT inserted through vocal cords under direct vision,  positive ETCO2 and breath sounds checked- equal and bilateral Tube secured with: Tape Dental Injury: Teeth and Oropharynx as per pre-operative assessment

## 2019-02-05 ENCOUNTER — Encounter (HOSPITAL_COMMUNITY): Payer: Self-pay | Admitting: Surgery

## 2019-02-05 NOTE — Anesthesia Postprocedure Evaluation (Signed)
Anesthesia Post Note  Patient: Jose [name]  Procedure(s) Performed: UMBILICAL HERNIA REPAIR WITH MESH (N/A Abdomen)     Patient location during evaluation: PACU Anesthesia Type: General Level of consciousness: awake and alert Pain management: pain level controlled Vital Signs Assessment: post-procedure vital signs reviewed and stable Respiratory status: spontaneous breathing, nonlabored ventilation, respiratory function stable and patient connected to nasal cannula oxygen Cardiovascular status: blood pressure returned to baseline and stable Postop Assessment: no apparent nausea or vomiting Anesthetic complications: no    Last Vitals:  Vitals:   02/04/19 1645 02/04/19 1700  BP: (!) 153/91 (!) 148/85  Pulse: 82 81  Resp: 19 18  Temp:  36.4 C  SpO2: 94% 94%    Last Pain:  Vitals:   02/04/19 1700  TempSrc:   PainSc: 4                  Aniylah Avans S

## 2019-02-25 ENCOUNTER — Telehealth: Payer: Self-pay | Admitting: Medical

## 2019-02-25 NOTE — Telephone Encounter (Signed)
Received requested records from [facility]. Sending back for review.

## 2019-07-05 ENCOUNTER — Ambulatory Visit: Payer: No Typology Code available for payment source | Admitting: Medical

## 2020-02-18 ENCOUNTER — Ambulatory Visit: Payer: No Typology Code available for payment source | Attending: Internal Medicine

## 2020-02-18 DIAGNOSIS — Z23 Encounter for immunization: Secondary | ICD-10-CM

## 2020-02-18 NOTE — Progress Notes (Signed)
   Covid-19 Vaccination Clinic  Name:  Jose [name]    MRN: [id number] DOB: Apr 19, 1966  02/18/2020  Jose [name] was observed post Covid-19 immunization for 15 minutes without incident. He was provided with Vaccine Information Sheet and instruction to access the V-Safe system.   Jose [name] was instructed to call 911 with any severe reactions post vaccine: Marland Kitchen Difficulty breathing  . Swelling of face and throat  . A fast heartbeat  . A bad rash all over body  . Dizziness and weakness   Immunizations Administered    Name Date Dose VIS Date Route   Pfizer COVID-19 Vaccine 02/18/2020  9:08 AM 0.3 mL 11/19/2019 Intramuscular   Manufacturer: [employer], [employer]   Lot: [id number]   NDC: [address]-0

## 2020-03-13 ENCOUNTER — Ambulatory Visit: Payer: No Typology Code available for payment source | Attending: Internal Medicine

## 2020-03-13 DIAGNOSIS — Z23 Encounter for immunization: Secondary | ICD-10-CM

## 2020-03-13 NOTE — Progress Notes (Signed)
   Covid-19 Vaccination Clinic  Name:  Jose [name]    MRN: [id number] DOB: [birth year]  03/13/2020  [name] was observed post Covid-19 immunization for 15 minutes without incident. He was provided with Vaccine Information Sheet and instruction to access the V-Safe system.   [name] was instructed to call 911 with any severe reactions post vaccine: Marland Kitchen Difficulty breathing  . Swelling of face and throat  . A fast heartbeat  . A bad rash all over body  . Dizziness and weakness   Immunizations Administered    Name Date Dose VIS Date Route   Pfizer COVID-19 Vaccine 03/13/2020 10:39 AM 0.3 mL 11/19/2019 Intramuscular   Manufacturer: [employer], [employer]   Lot: [id number]   NDC: [address]-0

## 2020-10-07 ENCOUNTER — Ambulatory Visit: Payer: No Typology Code available for payment source

## 2020-11-11 ENCOUNTER — Ambulatory Visit: Payer: No Typology Code available for payment source | Attending: Internal Medicine

## 2020-11-11 DIAGNOSIS — Z23 Encounter for immunization: Secondary | ICD-10-CM

## 2020-11-11 NOTE — Progress Notes (Signed)
   Covid-19 Vaccination Clinic  Name:  Jose [name]    MRN: [id number] DOB: 12/17/1965  11/11/2020  Mr. Jose [name] was observed post Covid-19 immunization for 15 minutes without incident. He was provided with Vaccine Information Sheet and instruction to access the V-Safe system.   Mr. Jose [name] was instructed to call 911 with any severe reactions post vaccine: Marland Kitchen Difficulty breathing  . Swelling of face and throat  . A fast heartbeat  . A bad rash all over body  . Dizziness and weakness   Immunizations Administered    Name Date Dose VIS Date Route   Pfizer COVID-19 Vaccine 11/11/2020  9:09 AM 0.3 mL 09/27/2020 Intramuscular   Manufacturer: [employer], [employer]   Lot: [id number]   NDC: [address]-9

## 2021-06-19 ENCOUNTER — Ambulatory Visit (INDEPENDENT_AMBULATORY_CARE_PROVIDER_SITE_OTHER): Payer: 59

## 2021-06-19 ENCOUNTER — Other Ambulatory Visit: Payer: Self-pay

## 2021-06-19 ENCOUNTER — Ambulatory Visit (HOSPITAL_COMMUNITY)
Admission: EM | Admit: 2021-06-19 | Discharge: 2021-06-19 | Disposition: A | Discharge status: Home / Self Care | Payer: 59 | Attending: Family Medicine | Admitting: Family Medicine

## 2021-06-19 ENCOUNTER — Encounter (HOSPITAL_COMMUNITY): Payer: Self-pay | Admitting: Emergency Medicine

## 2021-06-19 DIAGNOSIS — M79672 Pain in left foot: Secondary | ICD-10-CM | POA: Diagnosis not present

## 2021-06-19 DIAGNOSIS — B351 Tinea unguium: Secondary | ICD-10-CM | POA: Diagnosis not present

## 2021-06-19 DIAGNOSIS — S96912A Strain of unspecified muscle and tendon at ankle and foot level, left foot, initial encounter: Secondary | ICD-10-CM

## 2021-06-19 MED ORDER — IBUPROFEN 600 MG PO TABS: 600.0000 mg | ORAL_TABLET | Freq: Four times a day (QID) | ORAL | 0 refills | Status: DC | PRN

## 2021-06-19 MED ORDER — TERBINAFINE HCL 1 % EX CREA: 1.0000 "application " | TOPICAL_CREAM | Freq: Two times a day (BID) | CUTANEOUS | 0 refills | Status: DC

## 2021-06-19 NOTE — ED Provider Notes (Signed)
[facility]    CSN: [id number] Arrival date & time: 06/19/21  1900      History   Chief Complaint Chief Complaint  Patient presents with   Foot Pain    Left     HPI Jose [name] is a 55 y.o. male.   Patient presents to the urgent care today for evaluation of left-sided plantar foot pain.  Patient states that [place] the airport [place] and heard and felt a pop in the left bottom portion of his foot.  Patient does endorse some chronic right foot pain in that same area.  Has not ever been evaluated by other healthcare provider for this pain.  Denies any numbness or tingling.  Has not taken anything over-the-counter for relief of pain.  Denies any pain when not walking but states that pain is present when bearing weight.  Patient is also concerned about yellow discoloration to left great toenail and is concerned about toenail fungus.  Patient is not sure how long this has been present.   Foot Pain   Past Medical History:  Diagnosis Date   Arthritis 2005   knees & wrists ? , meniscus worn - cortisone injection -   Carpal tunnel syndrome 09/26/2015   Bilateral   Chronic pain of both knees 01/18/2019   Ganglion cyst of wrist, right 01/18/2019   Low libido    Obesity    Pain in both wrists 01/18/2019   Sickle cell trait (HCC)    Smoker    Umbilical hernia    Umbilical hernia without obstruction and without gangrene 01/18/2019   Wrist pain    Wrist swelling, unspecified laterality 01/18/2019    Patient Active Problem List   Diagnosis Date Noted   Umbilical hernia without obstruction and without gangrene 01/18/2019   Smoker 01/18/2019   Wrist swelling, unspecified laterality 01/18/2019   Pain in both wrists 01/18/2019   Ganglion cyst of wrist, right 01/18/2019   Preop examination 01/18/2019   Obesity 01/18/2019   Low libido 01/18/2019   Chronic pain of both knees 01/18/2019   Carpal tunnel syndrome 09/26/2015    Past Surgical History:  Procedure  Laterality Date   calcium deposit Left    Left arm- calcium deposit removed    UMBILICAL HERNIA REPAIR N/A 02/04/2019   Procedure: UMBILICAL HERNIA REPAIR WITH MESH;  Surgeon: Harriette Bouillon, MD;  Location: [facility] OR;  Service: General;  Laterality: N/A;       Home Medications    Prior to Admission medications   Medication Sig Start Date End Date Taking? Authorizing Provider  ibuprofen (ADVIL) 600 MG tablet Take 1 tablet (600 mg total) by mouth every 6 (six) hours as needed for mild pain or moderate pain. 06/19/21  Yes Lance Muss, FNP  terbinafine (LAMISIL AT) 1 % cream Apply 1 application topically 2 (two) times daily. 06/19/21  Yes Lance Muss, FNP  oxyCODONE (OXY IR/ROXICODONE) 5 MG immediate release tablet Take 1 tablet (5 mg total) by mouth every 6 (six) hours as needed for severe pain. 02/04/19   Harriette Bouillon, MD    Family History Family History  Problem Relation Age of Onset   Cancer Father    CAD Father     Social History Social History   Tobacco Use   Smoking status: Every Day    Packs/day: 0.75    Years: 0.00    Pack years: 0.00    Types: Cigarettes   Smokeless tobacco: Current  Types: Chew  Vaping Use   Vaping Use: Never used  Substance Use Topics   Alcohol use: Not Currently    Alcohol/week: 0.0 standard drinks   Drug use: Not Currently     Allergies   Patient has no known allergies.   Review of Systems Review of Systems Per HPI  Physical Exam Triage Vital Signs ED Triage Vitals  Enc Vitals Group     BP 06/19/21 2026 139/77     Pulse Rate 06/19/21 2026 74     Resp 06/19/21 2026 17     Temp 06/19/21 2026 98.4 F (36.9 C)     Temp Source 06/19/21 2026 Oral     SpO2 06/19/21 2026 98 %     Weight --      Height --      Head Circumference --      Peak Flow --      Pain Score 06/19/21 2025 8     Pain Loc --      Pain Edu? --      Excl. in GC? --    No data found.  Updated Vital Signs BP 139/77   Pulse 74   Temp 98.4 F (36.9  C) (Oral)   Resp 17   SpO2 98%   Visual Acuity Right Eye Distance:   Left Eye Distance:   Bilateral Distance:    Right Eye Near:   Left Eye Near:    Bilateral Near:     Physical Exam Constitutional:      Appearance: Normal appearance.  HENT:     Head: Normocephalic and atraumatic.  Eyes:     Extraocular Movements: Extraocular movements intact.     Conjunctiva/sclera: Conjunctivae normal.  Pulmonary:     Effort: Pulmonary effort is normal.  Musculoskeletal:     Right foot: Normal.     Left foot: Normal range of motion and normal capillary refill. Tenderness present. No swelling or deformity. Normal pulse.       Feet:     Comments: Tenderness present to left medial portion of the plantar portion of left foot approximately halfway up the foot that correlates to first metatarsal.  Skin:    General: Skin is warm and dry.     Comments: Left great toe nail has yellow discoloration and is hardened.  Neurological:     General: No focal deficit present.     Mental Status: He is alert and oriented to person, place, and time. Mental status is at baseline.  Psychiatric:        Mood and Affect: Mood normal.        Behavior: Behavior normal.        Thought Content: Thought content normal.        Judgment: Judgment normal.     UC Treatments / Results  Labs (all labs ordered are listed, but only abnormal results are displayed) Labs Reviewed - No data to display  EKG   Radiology DG Foot Complete Left  Result Date: 06/19/2021 CLINICAL DATA:  Stepped funny on foot today, pain and swelling on bottom of foot. swelling EXAM: LEFT FOOT - COMPLETE 3+ VIEW COMPARISON:  None. FINDINGS: No fracture or dislocation of mid foot or forefoot. The phalanges are normal. The calcaneus is normal. No soft tissue abnormality. IMPRESSION: No fracture or dislocation. Electronically Signed   By: Genevive Bi M.D.   On: 06/19/2021 21:08    Procedures Procedures (including critical care  time)  Medications Ordered in UC Medications -  No data to display  Initial Impression / Assessment and Plan / UC Course  I have reviewed the triage vital signs and the nursing notes.  Pertinent labs & imaging results that were available during my care of the patient were reviewed by me and considered in my medical decision making (see chart for details).     Left foot x-ray was negative for any fractures.  Suspect strain of muscle but cannot rule out plantar fasciitis as this is also an acute on chronic pain.  Patient was provided with podiatry contact information to follow-up tomorrow for further evaluation and management.  May apply ice to area for comfort.  Was prescribed ibuprofen as needed for pain and inflammation.  Patient was advised to not take any over-the-counter NSAIDs while on this ibuprofen.   Lamisil cream prescribed for toenail fungus.  Patient also to be evaluated by podiatry for this as well. Discussed strict return precautions. Patient verbalized understanding and is agreeable with plan.  Final Clinical Impressions(s) / UC Diagnoses   Final diagnoses:  Left foot pain  Muscle strain of left foot, initial encounter  Toenail fungus     Discharge Instructions      Your left foot x-ray was negative for fracture.  Suspect left muscle strain or plantar fasciitis due to chronicity of pain.  Please follow-up with provided contact information for podiatry to schedule an appointment tomorrow for further evaluation and management.  May take prescribed ibuprofen as needed for pain inflammation.  Do not take any additional ibuprofen, Advil, Aleve while taking this ibuprofen.  May use ice application to affected area of pain.  You have been prescribed Lamisil cream for toenail fungus.  Please follow-up with podiatry as well for further management of this.     ED Prescriptions     Medication Sig Dispense Auth. Provider   terbinafine (LAMISIL AT) 1 % cream Apply 1 application  topically 2 (two) times daily. 30 g Lance Muss, FNP   ibuprofen (ADVIL) 600 MG tablet Take 1 tablet (600 mg total) by mouth every 6 (six) hours as needed for mild pain or moderate pain. 30 tablet Lance Muss, FNP      PDMP not reviewed this encounter.   Lance Muss, FNP 06/19/21 2142

## 2021-06-19 NOTE — ED Triage Notes (Signed)
Pt is present with left foot pain. Pt states that he [place] the airport and heard a pop in his foot. Pt states that he does have swelling and cannot apply pressure to his foot.

## 2021-06-19 NOTE — Discharge Instructions (Signed)
Your left foot x-ray was negative for fracture.  Suspect left muscle strain or plantar fasciitis due to chronicity of pain.  Please follow-up with provided contact information for podiatry to schedule an appointment tomorrow for further evaluation and management.  May take prescribed ibuprofen as needed for pain inflammation.  Do not take any additional ibuprofen, Advil, Aleve while taking this ibuprofen.  May use ice application to affected area of pain.  You have been prescribed Lamisil cream for toenail fungus.  Please follow-up with podiatry as well for further management of this.

## 2021-06-20 ENCOUNTER — Ambulatory Visit: Payer: 59 | Admitting: Podiatry

## 2021-06-20 DIAGNOSIS — B351 Tinea unguium: Secondary | ICD-10-CM

## 2021-06-20 DIAGNOSIS — S93692A Other sprain of left foot, initial encounter: Secondary | ICD-10-CM | POA: Diagnosis not present

## 2021-06-20 MED ORDER — TERBINAFINE HCL 250 MG PO TABS: 250.0000 mg | ORAL_TABLET | Freq: Every day | ORAL | 0 refills | Status: DC

## 2021-06-20 NOTE — Progress Notes (Signed)
  Subjective:  Patient ID: Jose [name], male    DOB: 1965-12-31,  MRN: [id number]  Chief Complaint  Patient presents with   Foot Injury    NP - Urgent Work in-left foot injury referred by [facility]-    55 y.o. male presents with the above complaint. History confirmed with patient.  Injured his foot on 06/18/2021 while in the airport returning from [place].  Felt a pop in the plantar heel.  Went to [facility] urgent care on 7/12 and they completed an x-ray.  He was also prescribed terbinafine cream for nail fungus.  Objective:  Physical Exam: warm, good capillary refill, no trophic changes or ulcerative lesions, normal DP and PT pulses, and normal sensory exam. Left Foot: point tenderness of the mid plantar fascia no bruising or edema left hallux onychomycosis   Radiographs: Multiple views x-ray of the left foot: Radiographs from urgent care reviewed, no acute osseous abnormalities Assessment:   1. Rupture of plantar fascia of left foot, initial encounter   2. Onychomycosis      Plan:  Patient was evaluated and treated and all questions answered.  I discussed with him he likely has a plantar fascial tear probably partial.  I can feel some continuity within the plantar fascial still.  Recommend CAM boot immobilization and I wrote a prescription for this for [facility] for him for 4 weeks.  If still painful at 6 weeks return to see me.  Also discussed treatment of onychomycosis with topical and oral treatment.  I do not think the topical terbinafine will do much for him.  I recommended oral treatment with oral Lamisil.  He has no history of liver disease.  90-day course prescribed.  Follow-up in 4 months photographs were taken  Return in about 3 months (around 09/20/2021) for follow up after nail fungus treatment.

## 2021-06-21 ENCOUNTER — Telehealth: Payer: Self-pay | Admitting: Podiatry

## 2021-06-21 MED ORDER — FLUCONAZOLE 150 MG PO TABS: 450.0000 mg | ORAL_TABLET | ORAL | 0 refills | Status: AC

## 2021-06-21 NOTE — Addendum Note (Signed)
Addended byLilian Kapur, Jermond Burkemper R on: 06/21/2021 12:28 PM   Modules accepted: Orders

## 2021-06-21 NOTE — Telephone Encounter (Signed)
Patients wife calling to inform dr. Lilian Kapur that the [employer] is making him sick (after only taking 1) and would like an alternate. Please advise.

## 2021-06-22 ENCOUNTER — Telehealth: Payer: Self-pay | Admitting: *Deleted

## 2021-06-22 NOTE — Telephone Encounter (Signed)
"  I called yesterday about the medicine that my husband, Jose [name], is taking.  It's making him sick.  Nobody called me back."  It looks like Dr. Lilian Kapur sent a prescription for Fluconazole to his pharmacy yesterday.  He needs to take three tablets at one time once a week.  The instructions will be on the bottle from the pharmacist.  "Okay, I didn't know.  Why didn't anyone call and tell me that?"  I am not sure but I apologize.

## 2021-08-14 ENCOUNTER — Ambulatory Visit: Payer: 59 | Admitting: Medical

## 2021-09-24 ENCOUNTER — Encounter: Payer: 59 | Admitting: Podiatry

## 2021-12-11 ENCOUNTER — Ambulatory Visit (INDEPENDENT_AMBULATORY_CARE_PROVIDER_SITE_OTHER): Payer: 59

## 2021-12-11 ENCOUNTER — Other Ambulatory Visit: Payer: Self-pay

## 2021-12-11 DIAGNOSIS — Z23 Encounter for immunization: Secondary | ICD-10-CM

## 2023-03-12 ENCOUNTER — Ambulatory Visit: Payer: 59 | Admitting: Medical

## 2023-03-12 VITALS — BP 130/74 | HR 75 | Temp 97.8°F | Wt 306.4 lb

## 2023-03-12 DIAGNOSIS — M25431 Effusion, right wrist: Secondary | ICD-10-CM | POA: Diagnosis not present

## 2023-03-12 DIAGNOSIS — M25432 Effusion, left wrist: Secondary | ICD-10-CM | POA: Diagnosis not present

## 2023-03-12 DIAGNOSIS — G8929 Other chronic pain: Secondary | ICD-10-CM

## 2023-03-12 DIAGNOSIS — M2550 Pain in unspecified joint: Secondary | ICD-10-CM | POA: Diagnosis not present

## 2023-03-12 DIAGNOSIS — R5383 Other fatigue: Secondary | ICD-10-CM | POA: Diagnosis not present

## 2023-03-12 DIAGNOSIS — Z87891 Personal history of nicotine dependence: Secondary | ICD-10-CM | POA: Diagnosis not present

## 2023-03-12 DIAGNOSIS — R252 Cramp and spasm: Secondary | ICD-10-CM

## 2023-03-12 DIAGNOSIS — M25561 Pain in right knee: Secondary | ICD-10-CM | POA: Diagnosis not present

## 2023-03-12 DIAGNOSIS — D649 Anemia, unspecified: Secondary | ICD-10-CM | POA: Diagnosis not present

## 2023-03-12 DIAGNOSIS — M25531 Pain in right wrist: Secondary | ICD-10-CM | POA: Diagnosis not present

## 2023-03-12 DIAGNOSIS — M2560 Stiffness of unspecified joint, not elsewhere classified: Secondary | ICD-10-CM | POA: Diagnosis not present

## 2023-03-12 DIAGNOSIS — M25562 Pain in left knee: Secondary | ICD-10-CM

## 2023-03-12 DIAGNOSIS — M25532 Pain in left wrist: Secondary | ICD-10-CM | POA: Diagnosis not present

## 2023-03-12 NOTE — Progress Notes (Signed)
Subjective:  Jose [name] is a 57 y.o. male who presents for Chief Complaint  Patient presents with   Joint Swelling    Wrist pain on both wrists. Been going on 10-15 years- just getting worse     Here for bilat wrist pain.   Last visit here 4 years ago.  Left wrist has been swollen, both wrist hurt ongoing.  Has bilateral knee problems and other joint problems in general.  No recent injury, fall or trauma.  No fever.  Quit smoking a month ago.   No alcohol . Does drink a lot of soft drinks  Works in [employer].  Has morning stiffness.   Also gets cramping in feet, legs, and sometimes in side.  Feels fatigued a lot.  In the past he has had very low vitamin D and low testosterone.  Not currently on vitamin D therapy.  No significant snoring noted by wife.  No daytime somnolence or witnessed apnea  No other aggravating or relieving factors.    No other c/o.  Past Medical History:  Diagnosis Date   Arthritis 2005   knees & wrists ? , meniscus worn - cortisone injection -   Carpal tunnel syndrome 09/26/2015   Bilateral   Chronic pain of both knees 01/18/2019   Ganglion cyst of wrist, right 01/18/2019   Low libido    Obesity    Pain in both wrists 01/18/2019   Sickle cell trait ([facility])    Smoker    Umbilical hernia    Umbilical hernia without obstruction and without gangrene 01/18/2019   Wrist pain    Wrist swelling, unspecified laterality 01/18/2019    Current Outpatient Medications on File Prior to Visit  Medication Sig Dispense Refill   ibuprofen (ADVIL) 600 MG tablet Take 1 tablet (600 mg total) by mouth every 6 (six) hours as needed for mild pain or moderate pain. 30 tablet 0   No current facility-administered medications on file prior to visit.     The following portions of the patient's history were reviewed and updated as appropriate: allergies, current medications, past family history, past medical history, past social history, past surgical history and  problem list.  ROS Otherwise as in subjective above  Objective: BP 130/74   Pulse 75   Temp 97.8 F (36.6 C)   Wt (!) 306 lb 6.4 oz (139 kg)   BMI 43.96 kg/m   General appearance: alert, no distress, well developed, well nourished Oral cavity: MMM, no lesions Neck: supple, no lymphadenopathy, no thyromegaly, no masses Heart: RRR, normal S1, S2, no murmurs Pulses: 2+ radial pulses, 2+ pedal pulses, normal cap refill Ext: no edema, mild to moderate varicose veins particular in the right lower leg with some left lower leg MSK: Mild swelling of right wrist dorsal lateral joint area, decreased range of motion of both wrist in general mildly, some bony arthritic changes of phalanges particularly PIP and DIPs, no other deformity of hands or wrist Bilateral knees with some bony arthritic changes noted particular tibial plateau and patella, but relatively normal range of motion, no obvious swelling, no obvious laxity Rest of the leg exam is unremarkable   Assessment: Encounter Diagnoses  Name Primary?   Polyarthralgia Yes   Muscle cramps    Other fatigue    Chronic pain of both knees    Pain in both wrists    Swelling of both wrists    Morning stiffness of joints    Former smoker  Plan: We discussed his concerns of joint pain and swelling, polyarthralgia, morning stiffness.  We discussed possible causes.  Could just be osteoarthritis that has gotten worse over time.  But given the morning stiffness and wrist and vomiting, screening labs as below and additional blood work as below.  Ultimately he may need to have x-rays or go to see orthopedics.  Consider x-rays of both knees and both wrist in the near future  Fatigue-discussed possible causes in general.  We discussed his prior low vitamin D and low testosterone.  That is beyond the scope of today's visit I did advise he get back on vitamin D 2000 units over-the-counter to start with.  If he wants to look at testosterone again he  can come in separate for that in the near future.  Congratulated him on quitting smoking recently.  Cramping-labs as below  Yohann was seen today for joint swelling.  Diagnoses and all orders for this visit:  Polyarthralgia -     Sedimentation rate -     Uric acid -     Rheumatoid factor -     Comprehensive metabolic panel -     TSH -     CYCLIC CITRUL PEPTIDE ANTIBODY, IGG/IGA -     CBC  Muscle cramps -     Comprehensive metabolic panel  Other fatigue -     TSH -     CBC  Chronic pain of both knees -     Sedimentation rate -     Uric acid -     Rheumatoid factor -     Comprehensive metabolic panel -     CYCLIC CITRUL PEPTIDE ANTIBODY, IGG/IGA  Pain in both wrists -     Sedimentation rate -     Uric acid -     Rheumatoid factor -     Comprehensive metabolic panel -     CYCLIC CITRUL PEPTIDE ANTIBODY, IGG/IGA  Swelling of both wrists -     Sedimentation rate -     Uric acid -     Rheumatoid factor -     Comprehensive metabolic panel -     CYCLIC CITRUL PEPTIDE ANTIBODY, IGG/IGA  Morning stiffness of joints -     Sedimentation rate -     Uric acid -     Rheumatoid factor -     Comprehensive metabolic panel -     CYCLIC CITRUL PEPTIDE ANTIBODY, IGG/IGA  Former smoker    Follow up: Pending labs

## 2023-03-13 ENCOUNTER — Other Ambulatory Visit: Payer: Self-pay | Admitting: Medical

## 2023-03-13 DIAGNOSIS — M25439 Effusion, unspecified wrist: Secondary | ICD-10-CM

## 2023-03-13 DIAGNOSIS — M25531 Pain in right wrist: Secondary | ICD-10-CM

## 2023-03-13 DIAGNOSIS — G8929 Other chronic pain: Secondary | ICD-10-CM

## 2023-03-13 LAB — HGB A1C W/O EAG: Hgb A1c MFr Bld: 6.4 % — ABNORMAL HIGH (ref 4.8–5.6)

## 2023-03-13 LAB — CBC
Hematocrit: 44.1 % (ref 37.5–51.0)
Hemoglobin: 15.1 g/dL (ref 13.0–17.7)
MCH: 33.3 pg — ABNORMAL HIGH (ref 26.6–33.0)
MCHC: 34.2 g/dL (ref 31.5–35.7)
MCV: 97 fL (ref 79–97)
Platelets: 212 10*3/uL (ref 150–450)
RBC: 4.54 x10E6/uL (ref 4.14–5.80)
RDW: 10.7 % — ABNORMAL LOW (ref 11.6–15.4)
WBC: 5.5 10*3/uL (ref 3.4–10.8)

## 2023-03-13 LAB — COMPREHENSIVE METABOLIC PANEL
ALT: 40 IU/L (ref 0–44)
AST: 27 IU/L (ref 0–40)
Albumin/Globulin Ratio: 1.4 (ref 1.2–2.2)
Albumin: 4.1 g/dL (ref 3.8–4.9)
Alkaline Phosphatase: 80 IU/L (ref 44–121)
BUN/Creatinine Ratio: 16 (ref 9–20)
BUN: 13 mg/dL (ref 6–24)
Bilirubin Total: 0.5 mg/dL (ref 0.0–1.2)
CO2: 20 mmol/L (ref 20–29)
Calcium: 9.3 mg/dL (ref 8.7–10.2)
Chloride: 104 mmol/L (ref 96–106)
Creatinine, Ser: 0.81 mg/dL (ref 0.76–1.27)
Globulin, Total: 2.9 g/dL (ref 1.5–4.5)
Glucose: 225 mg/dL — ABNORMAL HIGH (ref 70–99)
Potassium: 4.2 mmol/L (ref 3.5–5.2)
Sodium: 138 mmol/L (ref 134–144)
Total Protein: 7 g/dL (ref 6.0–8.5)
eGFR: 102 mL/min/{1.73_m2} (ref >59)

## 2023-03-13 LAB — SPECIMEN STATUS REPORT

## 2023-03-13 LAB — CYCLIC CITRUL PEPTIDE ANTIBODY, IGG/IGA: Cyclic Citrullin Peptide Ab: 9 units (ref 0–19)

## 2023-03-13 LAB — SEDIMENTATION RATE: Sed Rate: 20 mm/hr (ref 0–30)

## 2023-03-13 LAB — URIC ACID: Uric Acid: 7.1 mg/dL (ref 3.8–8.4)

## 2023-03-13 LAB — RHEUMATOID FACTOR: Rheumatoid fact SerPl-aCnc: <10 IU/mL (ref <14.0)

## 2023-03-13 LAB — TSH: TSH: 1.06 u[IU]/mL (ref 0.450–4.500)

## 2023-03-13 NOTE — Progress Notes (Signed)
Labs show blood sugar elevated.  The same he was nonfasting, either way it was high.  Liver kidney and electrolytes normal, blood counts okay, sed rate marker for inflammation normal, uric acid marker for gout was higher than expected, rheumatoid factor normal, thyroid normal, still pending CCP rheumatoid marker  I recommend he return soon to recheck labs fasting for diabetes as he actually may be a diabetic given how high his sugar was  I strongly recommend he cut back significantly on sugary foods particularly sugary drinks like soda.  I recommend eating fruits and vegetables every day but limiting or cutting back on meat servings and dairy  I did put in x-ray orders to go have x-rays of wrist and knees.  Given information for [facility] imaging to go to x-rays  Please go to [facility] [employer].   Their hours are 8am - 4:30 pm Monday - Friday.  Take your insurance card with you.  [facility] [id number] [place], [place] [address]

## 2023-03-14 NOTE — Progress Notes (Signed)
The additional labs shows that he is right on the border of diabetes.He needs to make some significant changes in the diet, cutting out soda and other sugary drinks, cutting back on meat servings.  I do recommend you eat several fruits and vegetables every day.  Continue plan for x-rays.   we can follow-up after x-rays to discuss prediabetes further

## 2023-03-18 ENCOUNTER — Ambulatory Visit
Admission: RE | Admit: 2023-03-18 | Discharge: 2023-03-18 | Disposition: A | Discharge status: Home / Self Care | Payer: 59 | Source: Ambulatory Visit | Attending: Medical | Admitting: Medical

## 2023-03-18 DIAGNOSIS — G8929 Other chronic pain: Secondary | ICD-10-CM

## 2023-03-18 DIAGNOSIS — M25531 Pain in right wrist: Secondary | ICD-10-CM

## 2023-03-18 DIAGNOSIS — M1711 Unilateral primary osteoarthritis, right knee: Secondary | ICD-10-CM | POA: Diagnosis not present

## 2023-03-18 DIAGNOSIS — R600 Localized edema: Secondary | ICD-10-CM | POA: Diagnosis not present

## 2023-03-18 DIAGNOSIS — M25532 Pain in left wrist: Secondary | ICD-10-CM | POA: Diagnosis not present

## 2023-03-18 DIAGNOSIS — M7989 Other specified soft tissue disorders: Secondary | ICD-10-CM | POA: Diagnosis not present

## 2023-03-18 DIAGNOSIS — M1712 Unilateral primary osteoarthritis, left knee: Secondary | ICD-10-CM | POA: Diagnosis not present

## 2023-03-18 DIAGNOSIS — M25439 Effusion, unspecified wrist: Secondary | ICD-10-CM

## 2023-03-19 NOTE — Progress Notes (Signed)
Please call about x-ray results.  He had recent x-rays of wrist and knees.  He pretty much has arthritis in all of those areas, one of his kneecaps is in 2 pieces, there is some soft tissue swelling as well.  Lets see him back soon to discuss the prediabetes labs, the uric acid that was on the border which could be a factor with gout, and discuss ways to deal with his arthritis pains

## 2023-03-24 ENCOUNTER — Ambulatory Visit: Payer: 59 | Admitting: Medical

## 2023-03-24 VITALS — BP 124/70 | HR 82 | Wt 304.6 lb

## 2023-03-24 DIAGNOSIS — M67431 Ganglion, right wrist: Secondary | ICD-10-CM | POA: Diagnosis not present

## 2023-03-24 DIAGNOSIS — R7303 Prediabetes: Secondary | ICD-10-CM

## 2023-03-24 DIAGNOSIS — M25439 Effusion, unspecified wrist: Secondary | ICD-10-CM | POA: Diagnosis not present

## 2023-03-24 DIAGNOSIS — Z87891 Personal history of nicotine dependence: Secondary | ICD-10-CM | POA: Diagnosis not present

## 2023-03-24 DIAGNOSIS — G8929 Other chronic pain: Secondary | ICD-10-CM | POA: Diagnosis not present

## 2023-03-24 DIAGNOSIS — M25531 Pain in right wrist: Secondary | ICD-10-CM | POA: Diagnosis not present

## 2023-03-24 DIAGNOSIS — M25532 Pain in left wrist: Secondary | ICD-10-CM | POA: Diagnosis not present

## 2023-03-24 DIAGNOSIS — M25561 Pain in right knee: Secondary | ICD-10-CM

## 2023-03-24 DIAGNOSIS — M25562 Pain in left knee: Secondary | ICD-10-CM | POA: Diagnosis not present

## 2023-03-24 NOTE — Progress Notes (Signed)
Subjective: Chief Complaint  Patient presents with   follow-up    Follow-up on prediabetes, uric acid and arthritis and having a lot of cramps in legs   Here for f/u .  Was here recent for multiple joint pains.  Here to discuss his recent x-rays as well as abnormal lab findings including uric acid around 7 and blood sugar 225 fasting, A1c 6.4%.  Since we called him back about his lab results he has been making dietary changes.  He has cut out soda, has cut back on candy and junk food.  He is trying to make changes for the better.  He is active, walking all day long.  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  No new current symptoms of concern today.  He still complains of joint swelling and joint pains in multiple joints, swelling of the wrist.  He quit smoking a month ago.    Past Medical History:  Diagnosis Date   Arthritis 2005   knees & wrists ? , meniscus worn - cortisone injection -   Carpal tunnel syndrome 09/26/2015   Bilateral   Chronic pain of both knees 01/18/2019   Ganglion cyst of wrist, right 01/18/2019   Low libido    Obesity    Pain in both wrists 01/18/2019   Sickle cell trait ([facility])    Smoker    Umbilical hernia    Umbilical hernia without obstruction and without gangrene 01/18/2019   Wrist pain    Wrist swelling, unspecified laterality 01/18/2019   Current Outpatient Medications on File Prior to Visit  Medication Sig Dispense Refill   Cholecalciferol (VITAMIN D-3 PO) Take by mouth.     Glucosamine-Chondroitin (MOVE FREE PO) Take by mouth.     Multiple Vitamins-Minerals (ONE-A-DAY 50 PLUS PO) Take by mouth.     No current facility-administered medications on file prior to visit.   ROS as in subjective     Objective: BP 124/70   Pulse 82   Wt (!) 304 lb 9.6 oz (138.2 kg)   BMI 43.71 kg/m   Wt Readings from Last 3 Encounters:  03/24/23 (!) 304 lb 9.6 oz (138.2 kg)  03/12/23 (!) 306 lb 6.4 oz (139 kg)  02/04/19 (!) 308 lb  (139.7 kg)   General: Well-developed well-nourished no acute distress    Assessment: Encounter Diagnoses  Name Primary?   Chronic pain of both knees Yes   Ganglion cyst of wrist, right    Pain in both wrists    Wrist swelling, unspecified laterality    Former smoker    Prediabetes      Plan: Congratulated him on quitting smoking a month ago  We discussed his recent x-rays that were abnormal for both knees and both wrist.  We discussed his joint swelling.  He does not have morning stiffness.  His pain gets worse as the day goes on.  Referral to orthopedics for further discussion of his x-rays and other recommendations.  We discussed use of Tylenol as needed, limited use of NSAID as needed, can use topical creams such as capsaicin or Aspercreme.  Can use ice/cold therapy as needed.  Prediabetes and elevated uric acid-counseled on diet, prevention of progression to diabetes, exercise, monitoring.  Spent a lot of time discussing food choices and eating habits.   Jose [name] was seen today for follow-up.  Diagnoses and all orders for this visit:  Chronic pain of both knees -     Ambulatory referral  to Orthopedics  Ganglion cyst of wrist, right -     Ambulatory referral to Orthopedics  Pain in both wrists -     Ambulatory referral to Orthopedics  Wrist swelling, unspecified laterality -     Ambulatory referral to Orthopedics  Former smoker -     Ambulatory referral to Orthopedics  Prediabetes -     Ambulatory referral to Orthopedics     [id number]

## 2023-03-24 NOTE — Patient Instructions (Signed)
Encounter Diagnoses  Name Primary?   Chronic pain of both knees Yes   Ganglion cyst of wrist, right    Pain in both wrists    Wrist swelling, unspecified laterality    Former smoker    Prediabetes    I am glad to hear you are already making changes with your diet   Try to cut out all sugary drinks.  Really limit sweets and junk food.  Cut back on meat and animal products as well  I do recommend you try supplements such as glucosamine chondroitin and and possibly fish oil Gummies over-the-counter to help with joint pains.  Try to get some type of exercise regularly but avoid exercise that is hard on your joints such as jogging or running on pavement.  I am placing a referral to orthopedics for further discussion of your x-rays and joint pains.  Expect a phone call about this in the next week  You can use over-the-counter Tylenol once or twice a day occasionally for pain.  Try not to do this every single day.  You can also occasionally use anti-inflammatories such as Aleve or ibuprofen but this should be a second line after Tylenol.  You can use topical creams such as Aspercreme, capsaicin cream or even cold therapy such as ice   Prediabetes Prediabetes is when your blood sugar (blood glucose) level is higher than normal but not high enough for you to be diagnosed with type 2 diabetes. Having prediabetes puts you at risk for developing type 2 diabetes (type 2 diabetes mellitus). With certain lifestyle changes, you may be able to prevent or delay the onset of type 2 diabetes. This is important because type 2 diabetes can lead to serious complications, such as: Heart disease. Stroke. Blindness. Kidney disease. Depression. Poor circulation in the feet and legs. In severe cases, this could lead to surgical removal of a leg (amputation). What are the causes? The exact cause of prediabetes is not known. It may result from insulin resistance. Insulin resistance develops when cells in  the body do not respond properly to insulin that the body makes. This can cause excess glucose to build up in the blood. High blood glucose (hyperglycemia) can develop. What increases the risk? The following factors may make you more likely to develop this condition: You have a family member with type 2 diabetes. You are older than 45 years. You had a temporary form of diabetes during a pregnancy (gestational diabetes). You had polycystic ovary syndrome (PCOS). You are overweight or obese. You are inactive (sedentary). You have a history of heart disease, including problems with cholesterol levels, high levels of blood fats, or high blood pressure. What are the signs or symptoms? You may have no symptoms. If you do have symptoms, they may include: Increased hunger. Increased thirst. Increased urination. Vision changes, such as blurry vision. Tiredness (fatigue). How is this diagnosed? This condition can be diagnosed with blood tests. Your blood glucose may be checked with one or more of the following tests: A fasting blood glucose (FBG) test. You will not be allowed to eat (you will fast) for at least 8 hours before a blood sample is taken. An A1C blood test (hemoglobin A1C). This test provides information about blood glucose levels over the previous 2?3 months. An oral glucose tolerance test (OGTT). This test measures your blood glucose at two points in time: After fasting. This is your baseline level. Two hours after you drink a beverage that contains glucose. You may  be diagnosed with prediabetes if: Your FBG is 100?125 mg/dL (7.8-6.7 mmol/L). Your A1C level is 5.7?6.4% (39-46 mmol/mol). Your OGTT result is 140?199 mg/dL (6.7-20 mmol/L). These blood tests may be repeated to confirm your diagnosis. How is this treated? Treatment may include dietary and lifestyle changes to help lower your blood glucose and prevent type 2 diabetes from developing. In some cases, medicine may be  prescribed to help lower the risk of type 2 diabetes. Follow these instructions at home: Nutrition  Follow a healthy meal plan. This includes eating lean proteins, whole grains, legumes, fresh fruits and vegetables, low-fat dairy products, and healthy fats. Follow instructions from your health care provider about eating or drinking restrictions. Meet with a dietitian to create a healthy eating plan that is right for you. Lifestyle Do moderate-intensity exercise for at least 30 minutes a day on 5 or more days each week, or as told by your health care provider. A mix of activities may be best, such as: Brisk walking, swimming, biking, and weight lifting. Lose weight as told by your health care provider. Losing 5-7% of your body weight can reverse insulin resistance. Do not drink alcohol if: Your health care provider tells you not to drink. You are pregnant, may be pregnant, or are planning to become pregnant. If you drink alcohol: Limit how much you use to: 0-1 drink a day for women. 0-2 drinks a day for men. Be aware of how much alcohol is in your drink. In the [place] one drink equals one 12 oz bottle of beer (355 mL), one 5 oz glass of wine (148 mL), or one 1 oz glass of hard liquor (44 mL). General instructions Take over-the-counter and prescription medicines only as told by your health care provider. You may be prescribed medicines that help lower the risk of type 2 diabetes. Do not use any products that contain nicotine or tobacco, such as cigarettes, e-cigarettes, and chewing tobacco. If you need help quitting, ask your health care provider. Keep all follow-up visits. This is important. Where to find more information American Diabetes Association: www.diabetes.org Academy of Nutrition and Dietetics: www.eatright.org American Heart Association: www.heart.org Contact a health care provider if: You have any of these symptoms: Increased hunger. Increased urination. Increased  thirst. Fatigue. Vision changes, such as blurry vision. Get help right away if you: Have shortness of breath. Feel confused. Vomit or feel like you may vomit. Summary Prediabetes is when your blood sugar (blood glucose)level is higher than normal but not high enough for you to be diagnosed with type 2 diabetes. Having prediabetes puts you at risk for developing type 2 diabetes (type 2 diabetes mellitus). Make lifestyle changes such as eating a healthy diet and exercising regularly to help prevent diabetes. Lose weight as told by your health care provider. This information is not intended to replace advice given to you by your health care provider. Make sure you discuss any questions you have with your health care provider. Document Revised: 02/24/2020 Document Reviewed: 02/24/2020 Elsevier Patient Education  2022 [employer].    Other resources: [employer]fr  Lose weight  Get weight bearing and aerobic exercise Ear more plant based foods Eat healthy fats Skip fad diets

## 2023-04-07 ENCOUNTER — Ambulatory Visit (INDEPENDENT_AMBULATORY_CARE_PROVIDER_SITE_OTHER): Payer: 59 | Admitting: Surgical

## 2023-04-07 ENCOUNTER — Other Ambulatory Visit: Payer: Self-pay

## 2023-04-07 DIAGNOSIS — M19032 Primary osteoarthritis, left wrist: Secondary | ICD-10-CM | POA: Diagnosis not present

## 2023-04-07 DIAGNOSIS — M1712 Unilateral primary osteoarthritis, left knee: Secondary | ICD-10-CM | POA: Diagnosis not present

## 2023-04-07 DIAGNOSIS — M25532 Pain in left wrist: Secondary | ICD-10-CM

## 2023-04-13 ENCOUNTER — Encounter: Payer: Self-pay | Admitting: Surgical

## 2023-04-13 MED ORDER — METHYLPREDNISOLONE ACETATE 40 MG/ML IJ SUSP
13.3300 mg | INTRAMUSCULAR | Status: AC | PRN
Start: 2023-04-07 — End: 2023-04-07
  Administered 2023-04-07: 13.33 mg via INTRA_ARTICULAR

## 2023-04-13 MED ORDER — LIDOCAINE HCL 1 % IJ SOLN
5.0000 mL | INTRAMUSCULAR | Status: AC | PRN
Start: 2023-04-07 — End: 2023-04-07
  Administered 2023-04-07: 5 mL

## 2023-04-13 MED ORDER — BUPIVACAINE HCL 0.25 % IJ SOLN
4.0000 mL | INTRAMUSCULAR | Status: AC | PRN
Start: 2023-04-07 — End: 2023-04-07
  Administered 2023-04-07: 4 mL via INTRA_ARTICULAR

## 2023-04-13 MED ORDER — LIDOCAINE HCL 1 % IJ SOLN
3.0000 mL | INTRAMUSCULAR | Status: AC | PRN
Start: 2023-04-07 — End: 2023-04-07
  Administered 2023-04-07: 3 mL

## 2023-04-13 MED ORDER — BUPIVACAINE HCL 0.25 % IJ SOLN
0.6600 mL | INTRAMUSCULAR | Status: AC | PRN
Start: 2023-04-07 — End: 2023-04-07
  Administered 2023-04-07: .66 mL via INTRA_ARTICULAR

## 2023-04-13 MED ORDER — METHYLPREDNISOLONE ACETATE 40 MG/ML IJ SUSP
40.0000 mg | INTRAMUSCULAR | Status: AC | PRN
Start: 2023-04-07 — End: 2023-04-07
  Administered 2023-04-07: 40 mg via INTRA_ARTICULAR

## 2023-04-13 NOTE — Progress Notes (Signed)
Office Visit Note   Patient: Jose [name]           Date of Birth: 10-23-1966           MRN: [id number] Visit Date: 04/07/2023 Requested by: Jac Canavan, PA-C [address] [place],  [place] [address] PCP: Jac Canavan, PA-C  Subjective: Chief Complaint  Patient presents with   Right Knee - Pain   Left Knee - Pain   Right Wrist - Pain   Left Wrist - Pain    HPI: Jose [name] is a 57 y.o. male who presents to the office reporting bilateral knee pain and bilateral wrist pain.  States that he has had knee pain for about 20 years and mostly localizes pain to the anterior medial aspect of both knees.  Left bothers him more than his right knee.  Occasional Advil or Tylenol for his symptoms.  Had prior cortisone injection years ago that helped but he has not had any routine injections.  He describes catching and grinding sensation in both knees and feels stiffness with immobility.  No locking symptoms.  Pain does not wake him up at night.  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  No diabetes though he does have prediabetes.  He is a prior smoker and recently quit.  No history of prior surgery to his knees or wrists.  Regarding the wrist pain, left wrist bother him about equally but the left is slightly worse than the right currently.  Pain will wake him up from sleep at night.  He is left-hand dominant.  He describes primarily dorsal/radial pain in both wrists.  No history of injury to either wrist.  He uses his hands a lot when he is working and involves a lot of lifting and twisting with his wrist.  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                ROS: All systems reviewed are negative as they relate to the chief complaint within the history of present illness.  Patient denies fevers or chills.  Assessment & Plan: Visit Diagnoses:  1. Arthritis of left wrist   2. Pain in left  wrist   3. Unilateral primary osteoarthritis, left knee     Plan: Patient is a 57 year old male who presents for evaluation of bilateral knees and bilateral wrist pain.  He has radiographs of all 4 joints demonstrating moderate arthritic changes in each joint.  He has bipartite patella of the left knee.  Most symptomatic joints for him today are the left knee and left wrist joints.  He works in [employer] injury which involves a lot of lifting and standing for long periods of time.  We discussed the options available to patient.  The knees are not currently bothering him enough for any surgical intervention.  He has had good relief from cortisone injection in the left knee so he would like to repeat this in the left knee and try cortisone injection in the left wrist as well.  Under ultrasound guidance, cortisone injection successfully delivered into the radiocarpal joint of the left wrist.  Tolerated procedure without complication.  Left knee cortisone injection was successfully administered as well without complication.  We will plan to see him back as needed but if these injections significantly improve his pain, we could consider injecting the right wrist and  right knee in the near future about 10 to 14 days from now.  Follow-Up Instructions: No follow-ups on file.   Orders:  Orders Placed This Encounter  Procedures   US Guided Needle Placement - No Linked Charges   No orders of the defined types were placed in this encounter.     Procedures: Large Joint Inj: L knee on 04/07/2023 7:31 PM Indications: diagnostic evaluation, joint swelling and pain Details: 18 G 1.5 in needle, superolateral approach  Arthrogram: No  Medications: 5 mL lidocaine 1 %; 40 mg methylPREDNISolone acetate 40 MG/ML; 4 mL bupivacaine 0.25 % Outcome: tolerated well, no immediate complications Procedure, treatment alternatives, risks and benefits explained, specific risks discussed. Consent was given by the  patient. Immediately prior to procedure a time out was called to verify the correct patient, procedure, equipment, support staff and site/side marked as required. Patient was prepped and draped in the usual sterile fashion.    Medium Joint Inj: L radiocarpal on 04/07/2023 7:32 PM Indications: pain and diagnostic evaluation Details: 25 G 1.5 in needle, ultrasound-guided dorsal approach Medications: 3 mL lidocaine 1 %; 13.33 mg methylPREDNISolone acetate 40 MG/ML; 0.66 mL bupivacaine 0.25 % Outcome: tolerated well, no immediate complications Procedure, treatment alternatives, risks and benefits explained, specific risks discussed. Consent was given by the patient. Immediately prior to procedure a time out was called to verify the correct patient, procedure, equipment, support staff and site/side marked as required. Patient was prepped and draped in the usual sterile fashion.       Clinical Data: No additional findings.  Objective: Vital Signs: There were no vitals taken for this visit.  Physical Exam:  Constitutional: Patient appears well-developed HEENT:  Head: Normocephalic Eyes:EOM are normal Neck: Normal range of motion Cardiovascular: Normal rate Pulmonary/chest: Effort normal Neurologic: Patient is alert Skin: Skin is warm Psychiatric: Patient has normal mood and affect  Ortho Exam: Ortho exam demonstrates bilateral wrist with palpable radial pulse.  Intact EPL, FPL, finger abduction, finger abduction, pronation/supination.  There is increased swelling in the left wrist relative to the right.  There is tenderness through the anatomical snuffbox as well as the 3-4 portal in the left wrist and right wrist to a lesser extent.  No pain with thumb circumduction bilaterally.  Negative Finkelstein's test.  Negative Tinel's sign negative Durkan sign bilaterally.  Bilateral knees with tenderness over the medial joint line.  Small effusion of the left knee.  No effusion of the right knee.   No calf tenderness bilaterally.  Negative Homans' sign bilaterally.  No pain with hip range of motion.  He is able to perform straight leg raise without extensor lag.  No cellulitis or skin changes noted.  Stable to anterior posterior drawer sign.  Stable to varus valgus stress at 0 and 30 degrees.  Specialty Comments:  No specialty comments available.  Imaging: No results found.   PMFS History: Patient Active Problem List   Diagnosis Date Noted   Umbilical hernia without obstruction and without gangrene 01/18/2019   Wrist swelling, unspecified laterality 01/18/2019   Pain in both wrists 01/18/2019   Ganglion cyst of wrist, right 01/18/2019   Preop examination 01/18/2019   Obesity 01/18/2019   Low libido 01/18/2019   Chronic pain of both knees 01/18/2019   Carpal tunnel syndrome 09/26/2015   Past Medical History:  Diagnosis Date   Arthritis 2005   knees & wrists ? , meniscus worn - cortisone injection -   Carpal tunnel syndrome 09/26/2015   Bilateral  Chronic pain of both knees 01/18/2019   Ganglion cyst of wrist, right 01/18/2019   Low libido    Obesity    Pain in both wrists 01/18/2019   Sickle cell trait (HCC)    Smoker    Umbilical hernia    Umbilical hernia without obstruction and without gangrene 01/18/2019   Wrist pain    Wrist swelling, unspecified laterality 01/18/2019    Family History  Problem Relation Age of Onset   Cancer Father    CAD Father     Past Surgical History:  Procedure Laterality Date   calcium deposit Left    Left arm- calcium deposit removed    UMBILICAL HERNIA REPAIR N/A 02/04/2019   Procedure: UMBILICAL HERNIA REPAIR WITH MESH;  Surgeon: Harriette Bouillon, MD;  Location: [facility] OR;  Service: General;  Laterality: N/A;   Social History   Occupational History   Not on file  Tobacco Use   Smoking status: Every Day    Packs/day: 0.75    Years: 0.00    Additional pack years: 0.00    Total pack years: 0.00    Types: Cigarettes   Smokeless  tobacco: Current    Types: Chew  Vaping Use   Vaping Use: Never used  Substance and Sexual Activity   Alcohol use: Not Currently    Alcohol/week: 0.0 standard drinks of alcohol   Drug use: Not Currently   Sexual activity: Not on file

## 2023-11-04 ENCOUNTER — Ambulatory Visit: Payer: 59 | Admitting: Medical

## 2023-11-04 ENCOUNTER — Encounter: Payer: Self-pay | Admitting: Medical

## 2023-11-04 VITALS — BP 122/76 | HR 82 | Temp 97.2°F | Wt 303.2 lb

## 2023-11-04 DIAGNOSIS — Z1211 Encounter for screening for malignant neoplasm of colon: Secondary | ICD-10-CM

## 2023-11-04 DIAGNOSIS — R7303 Prediabetes: Secondary | ICD-10-CM | POA: Diagnosis not present

## 2023-11-04 DIAGNOSIS — R1030 Lower abdominal pain, unspecified: Secondary | ICD-10-CM | POA: Diagnosis not present

## 2023-11-04 DIAGNOSIS — R7301 Impaired fasting glucose: Secondary | ICD-10-CM | POA: Diagnosis not present

## 2023-11-04 DIAGNOSIS — Z125 Encounter for screening for malignant neoplasm of prostate: Secondary | ICD-10-CM

## 2023-11-04 LAB — POCT URINALYSIS DIP (PROADVANTAGE DEVICE)
Bilirubin, UA: NEGATIVE
Blood, UA: NEGATIVE
Glucose, UA: NEGATIVE mg/dL
Ketones, POC UA: NEGATIVE mg/dL
Leukocytes, UA: NEGATIVE
Nitrite, UA: NEGATIVE
Protein Ur, POC: NEGATIVE mg/dL
Specific Gravity, Urine: 1.015
Urobilinogen, Ur: NEGATIVE
pH, UA: 6 (ref 5.0–8.0)

## 2023-11-04 NOTE — Progress Notes (Signed)
Subjective:  Jose [name] is a 57 y.o. male who presents for Chief Complaint  Patient presents with   Abdominal Pain    Abdominal pain x 4 days off and on. Had hernia surgery before covid and just want to make sure nothing is wrong. Having some pain below belly button.      Here for abdominal pain.  He notes a pain up under the lower part of his abdomen just under the bellybutton.  Pain has been on and off for the last 4 days.  It feels like a hot coal or burning sensation.  It is somewhat central.  He has a history of bellybutton hernia surgery several years ago.  He notes some urinary changes over time that may be related to his prostate such as decreased urine stream and frequency.  He denies burning with urination, no blood in urine, no odor, no incontinence but he does have to get to the bathroom quicker.  He does have urinary frequency.  No bowel issues.  He has a daily bowel movement.  No constipation or diarrhea.  No blood in stool.  When he gets the belly pain it will last for seconds but has been on and off and got little bit more frequently.  Has had 2 brother with diverticulitis, and one had to have some colon removed due to diverticulitis.   For a while now he has no sexual desire.  No other aggravating or relieving factors.    No other c/o.  Past Medical History:  Diagnosis Date   Arthritis 2005   knees & wrists ? , meniscus worn - cortisone injection -   Carpal tunnel syndrome 09/26/2015   Bilateral   Chronic pain of both knees 01/18/2019   Ganglion cyst of wrist, right 01/18/2019   Low libido    Obesity    Pain in both wrists 01/18/2019   Sickle cell trait (HCC)    Smoker    Umbilical hernia    Umbilical hernia without obstruction and without gangrene 01/18/2019   Wrist pain    Wrist swelling, unspecified laterality 01/18/2019   Current Outpatient Medications on File Prior to Visit  Medication Sig Dispense Refill   Glucosamine-Chondroitin (MOVE FREE PO) Take  by mouth. (Patient not taking: Reported on 11/04/2023)     Multiple Vitamins-Minerals (ONE-A-DAY 50 PLUS PO) Take by mouth. (Patient not taking: Reported on 11/04/2023)     No current facility-administered medications on file prior to visit.     The following portions of the patient's history were reviewed and updated as appropriate: allergies, current medications, past family history, past medical history, past social history, past surgical history and problem list.  ROS Otherwise as in subjective above    Objective: BP 122/76   Pulse 82   Temp (!) 97.2 F (36.2 C)   Wt (!) 303 lb 3.2 oz (137.5 kg)   BMI 43.50 kg/m   Wt Readings from Last 3 Encounters:  11/04/23 (!) 303 lb 3.2 oz (137.5 kg)  03/24/23 (!) 304 lb 9.6 oz (138.2 kg)  03/12/23 (!) 306 lb 6.4 oz (139 kg)   General appearance: alert, no distress, well developed, well nourished Abdomen: +bs, soft, obese abdomen, umbilicus with surgical scars, discolored brownish darker skin along the lower abdomen under the pannus, no obvious nodule or lesion, otherwise non tender, non distended, no masses, no hepatomegaly, no splenomegaly Pulses: 2+ radial pulses, 2+ pedal pulses, normal cap refill Ext: no edema GU/rectal deferred   Assessment: Encounter  Diagnoses  Name Primary?   Lower abdominal pain Yes   Screen for colon cancer    Screening for prostate cancer    Prediabetes    Impaired fasting blood sugar      Plan: Abdominal pain-etiology unclear.  We discussed the wide differential.  Labs as below, consider CT abdomen pelvis.  Referral for colon cancer screening.  Impaired glucose, prediabetic-updated labs today  He is past due on screenings  We will update PSA prostate cancer screening and referral for colonoscopy today.  This will be his first colonoscopy  Looking back he does not historically come in for physicals regular.   Jose [name] was seen today for abdominal pain.  Diagnoses and all orders for this  visit:  Lower abdominal pain -     POCT Urinalysis DIP (Proadvantage Device)  Screen for colon cancer -     Ambulatory referral to Gastroenterology  Screening for prostate cancer -     PSA  Prediabetes -     Hemoglobin A1c -     Comprehensive metabolic panel  Impaired fasting blood sugar    Follow up: Pending labs

## 2023-11-05 ENCOUNTER — Other Ambulatory Visit: Payer: Self-pay | Admitting: Medical

## 2023-11-05 DIAGNOSIS — R109 Unspecified abdominal pain: Secondary | ICD-10-CM

## 2023-11-05 DIAGNOSIS — R140 Abdominal distension (gaseous): Secondary | ICD-10-CM

## 2023-11-05 DIAGNOSIS — R39198 Other difficulties with micturition: Secondary | ICD-10-CM

## 2023-11-05 LAB — COMPREHENSIVE METABOLIC PANEL
ALT: 51 [IU]/L — ABNORMAL HIGH (ref 0–44)
AST: 30 [IU]/L (ref 0–40)
Albumin: 4.2 g/dL (ref 3.8–4.9)
Alkaline Phosphatase: 76 [IU]/L (ref 44–121)
BUN/Creatinine Ratio: 11 (ref 9–20)
BUN: 11 mg/dL (ref 6–24)
Bilirubin Total: 0.4 mg/dL (ref 0.0–1.2)
CO2: 24 mmol/L (ref 20–29)
Calcium: 9.8 mg/dL (ref 8.7–10.2)
Chloride: 104 mmol/L (ref 96–106)
Creatinine, Ser: 0.96 mg/dL (ref 0.76–1.27)
Globulin, Total: 3.2 g/dL (ref 1.5–4.5)
Glucose: 128 mg/dL — ABNORMAL HIGH (ref 70–99)
Potassium: 4.3 mmol/L (ref 3.5–5.2)
Sodium: 140 mmol/L (ref 134–144)
Total Protein: 7.4 g/dL (ref 6.0–8.5)
eGFR: 91 mL/min/{1.73_m2} (ref >59)

## 2023-11-05 LAB — PSA: Prostate Specific Ag, Serum: 2.2 ng/mL (ref 0.0–4.0)

## 2023-11-05 LAB — HEMOGLOBIN A1C
Est. average glucose Bld gHb Est-mCnc: 151 mg/dL
Hgb A1c MFr Bld: 6.9 % — ABNORMAL HIGH (ref 4.8–5.6)

## 2023-11-05 MED ORDER — TRULICITY 1.5 MG/0.5ML ~~LOC~~ SOAJ: 1.5000 mg | SUBCUTANEOUS | 0 refills | Status: DC

## 2023-11-05 MED ORDER — TRULICITY 0.75 MG/0.5ML ~~LOC~~ SOAJ: 0.7500 mg | SUBCUTANEOUS | 0 refills | Status: DC

## 2023-11-05 NOTE — Progress Notes (Signed)
Results sent through MyChart

## 2023-11-05 NOTE — Progress Notes (Signed)
  PSA marker normal at 2.2.  1 liver test slightly elevated, kidney electrolytes okay.  Unfortunately your blood sugars are now in the diabetic range  Recommendations: 1-expect a phone call about referral for colon cancer screening 2-if agreeable, we can move forward with CT scan of abdomen/pelvis 3-regarding diabetes, I recommend trial of 1 of 2 medications, either metformin oral tablet twice daily to begin with, or a medication called mounjaro (weekly injection with tiny needle ) to help with sugar control and weight loss.    Continue to work on efforts with health diet and get exercise regularly  Let me know about scan and other recommendations above

## 2023-11-07 ENCOUNTER — Telehealth: Payer: Self-pay | Admitting: Medical

## 2023-11-07 NOTE — Telephone Encounter (Signed)
P.A. TRULICITY 

## 2023-11-10 ENCOUNTER — Encounter: Payer: Self-pay | Admitting: Medical

## 2023-11-16 NOTE — Telephone Encounter (Signed)
P.A. denied pt needs trial of Metformin, do you want to switch?

## 2023-11-17 ENCOUNTER — Other Ambulatory Visit: Payer: Self-pay | Admitting: Medical

## 2023-11-17 MED ORDER — METFORMIN HCL 500 MG PO TABS: 500.0000 mg | ORAL_TABLET | Freq: Two times a day (BID) | ORAL | 2 refills | Status: DC

## 2023-11-17 NOTE — Telephone Encounter (Signed)
Called & spoke with wife she is ok with pt starting Metformin, she asked that you go ahead & send ASAP she is going to pharmacy this morning.  She will call with any issues with the medication.

## 2023-11-22 NOTE — Telephone Encounter (Signed)
done

## 2023-11-25 ENCOUNTER — Other Ambulatory Visit: Payer: 59

## 2023-12-03 ENCOUNTER — Telehealth: Payer: 59 | Admitting: Family Medicine

## 2023-12-03 DIAGNOSIS — J209 Acute bronchitis, unspecified: Secondary | ICD-10-CM | POA: Diagnosis not present

## 2023-12-03 MED ORDER — BENZONATATE 100 MG PO CAPS: 100.0000 mg | ORAL_CAPSULE | Freq: Three times a day (TID) | ORAL | 0 refills | Status: AC | PRN

## 2023-12-03 MED ORDER — GUAIFENESIN 100 MG/5ML PO LIQD: 5.0000 mL | ORAL | 0 refills | Status: AC | PRN

## 2023-12-03 MED ORDER — DOXYCYCLINE HYCLATE 100 MG PO TABS
100.0000 mg | ORAL_TABLET | Freq: Two times a day (BID) | ORAL | 0 refills | Status: AC
Start: 2023-12-03 — End: 2023-12-10

## 2023-12-03 NOTE — Progress Notes (Signed)
Virtual Visit Consent   Jose [name], you are scheduled for a virtual visit with a [facility] provider today. Just as with appointments in the office, your consent must be obtained to participate. Your consent will be active for this visit and any virtual visit you may have with one of our providers in the next 365 days. If you have a MyChart account, a copy of this consent can be sent to you electronically.  As this is a virtual visit, video technology does not allow for your provider to perform a traditional examination. This may limit your provider's ability to fully assess your condition. If your provider identifies any concerns that need to be evaluated in person or the need to arrange testing (such as labs, EKG, etc.), we will make arrangements to do so. Although advances in technology are sophisticated, we cannot ensure that it will always work on either your end or our end. If the connection with a video visit is poor, the visit may have to be switched to a telephone visit. With either a video or telephone visit, we are not always able to ensure that we have a secure connection.  By engaging in this virtual visit, you consent to the provision of healthcare and authorize for your insurance to be billed (if applicable) for the services provided during this visit. Depending on your insurance coverage, you may receive a charge related to this service.  I need to obtain your verbal consent now. Are you willing to proceed with your visit today? [name] has provided verbal consent on 12/03/2023 for a virtual visit (video or telephone). Jose [name], [place]  Date: 12/03/2023 9:04 AM  Virtual Visit via Video Note   I, Jose [name], connected with  Jose [name]  ([id number], 09/10/1966) on 12/03/23 at  9:00 AM EST by a video-enabled telemedicine application and verified that I am speaking with the correct person using two identifiers.  Location: Patient: Virtual Visit Location Patient:  Home Provider: Virtual Visit Location Provider: Home Office   I discussed the limitations of evaluation and management by telemedicine and the availability of in person appointments. The patient expressed understanding and agreed to proceed.    History of Present Illness: Jose [name] is a 57 y.o. who identifies as a male who was assigned male at birth, and is being seen today for c/o having a problem breathing. Pt states tylenol, sudafed. Pt states wife contracted bronchitis and a sinus infection. Pt c/o coughing and wheezing. Pt states no fever, chills or body aches. Pt states coughing and ribs are hurting. Pt states symptoms on going for about a week and a half.   HPI: HPI  Problems:  Patient Active Problem List   Diagnosis Date Noted   Umbilical hernia without obstruction and without gangrene 01/18/2019   Wrist swelling, unspecified laterality 01/18/2019   Pain in both wrists 01/18/2019   Ganglion cyst of wrist, right 01/18/2019   Preop examination 01/18/2019   Obesity 01/18/2019   Low libido 01/18/2019   Chronic pain of both knees 01/18/2019   Carpal tunnel syndrome 09/26/2015    Allergies: No Known Allergies Medications:  Current Outpatient Medications:    benzonatate (TESSALON) 100 MG capsule, Take 1 capsule (100 mg total) by mouth 3 (three) times daily as needed for up to 7 days., Disp: 21 capsule, Rfl: 0   doxycycline (VIBRA-TABS) 100 MG tablet, Take 1 tablet (100 mg total) by mouth 2 (two) times daily for 7 days., Disp: 14  tablet, Rfl: 0   guaiFENesin (ROBITUSSIN) 100 MG/5ML liquid, Take 5 mLs by mouth every 4 (four) hours as needed for up to 7 days for cough or to loosen phlegm., Disp: 120 mL, Rfl: 0   Glucosamine-Chondroitin (MOVE FREE PO), Take by mouth. (Patient not taking: Reported on 11/04/2023), Disp: , Rfl:    metFORMIN (GLUCOPHAGE) 500 MG tablet, Take 1 tablet (500 mg total) by mouth 2 (two) times daily with a meal., Disp: 60 tablet, Rfl: 2   Multiple  Vitamins-Minerals (ONE-A-DAY 50 PLUS PO), Take by mouth. (Patient not taking: Reported on 11/04/2023), Disp: , Rfl:   Observations/Objective: Patient is well-developed, well-nourished in no acute distress.  Resting comfortably at home.  Head is normocephalic, atraumatic.  No labored breathing.  Speech is clear and coherent with logical content.  Patient is alert and oriented at baseline.    Assessment and Plan: 1. Acute bronchitis, unspecified organism (Primary) - doxycycline (VIBRA-TABS) 100 MG tablet; Take 1 tablet (100 mg total) by mouth 2 (two) times daily for 7 days.  Dispense: 14 tablet; Refill: 0 - benzonatate (TESSALON) 100 MG capsule; Take 1 capsule (100 mg total) by mouth 3 (three) times daily as needed for up to 7 days.  Dispense: 21 capsule; Refill: 0 - guaiFENesin (ROBITUSSIN) 100 MG/5ML liquid; Take 5 mLs by mouth every 4 (four) hours as needed for up to 7 days for cough or to loosen phlegm.  Dispense: 120 mL; Refill: 0  -Start Doxycyline, Tessalon and Guaifenesin for symptoms.  -Advised Pt if worsening symptoms to follow up with PCP or proceed to urgent care  -Pt verbalized understanding  Follow Up Instructions: I discussed the assessment and treatment plan with the patient. The patient was provided an opportunity to ask questions and all were answered. The patient agreed with the plan and demonstrated an understanding of the instructions.  A copy of instructions were sent to the patient via MyChart unless otherwise noted below.     The patient was advised to call back or seek an in-person evaluation if the symptoms worsen or if the condition fails to improve as anticipated.    Jose Pandy, PA-C

## 2023-12-03 NOTE — Patient Instructions (Signed)
Monia Sabal, thank you for joining [name], PA-C for today's virtual visit.  While this provider is not your primary care provider (PCP), if your PCP is located in our provider database this encounter information will be shared with them immediately following your visit.   A [facility] MyChart account gives you access to today's visit and all your visits, tests, and labs performed at [facility] " click here if you don't have a [facility] MyChart account or go to [email or web address]  Consent: (Patient) Jose [name] provided verbal consent for this virtual visit at the beginning of the encounter.  Current Medications:  Current Outpatient Medications:    benzonatate (TESSALON) 100 MG capsule, Take 1 capsule (100 mg total) by mouth 3 (three) times daily as needed for up to 7 days., Disp: 21 capsule, Rfl: 0   doxycycline (VIBRA-TABS) 100 MG tablet, Take 1 tablet (100 mg total) by mouth 2 (two) times daily for 7 days., Disp: 14 tablet, Rfl: 0   guaiFENesin (ROBITUSSIN) 100 MG/5ML liquid, Take 5 mLs by mouth every 4 (four) hours as needed for up to 7 days for cough or to loosen phlegm., Disp: 120 mL, Rfl: 0   Glucosamine-Chondroitin (MOVE FREE PO), Take by mouth. (Patient not taking: Reported on 11/04/2023), Disp: , Rfl:    metFORMIN (GLUCOPHAGE) 500 MG tablet, Take 1 tablet (500 mg total) by mouth 2 (two) times daily with a meal., Disp: 60 tablet, Rfl: 2   Multiple Vitamins-Minerals (ONE-A-DAY 50 PLUS PO), Take by mouth. (Patient not taking: Reported on 11/04/2023), Disp: , Rfl:    Medications ordered in this encounter:  Meds ordered this encounter  Medications   doxycycline (VIBRA-TABS) 100 MG tablet    Sig: Take 1 tablet (100 mg total) by mouth 2 (two) times daily for 7 days.    Dispense:  14 tablet    Refill:  0   benzonatate (TESSALON) 100 MG capsule    Sig: Take 1 capsule (100 mg total) by mouth 3 (three) times daily as needed for up to 7 days.    Dispense:  21  capsule    Refill:  0   guaiFENesin (ROBITUSSIN) 100 MG/5ML liquid    Sig: Take 5 mLs by mouth every 4 (four) hours as needed for up to 7 days for cough or to loosen phlegm.    Dispense:  120 mL    Refill:  0     *If you need refills on other medications prior to your next appointment, please contact your pharmacy*  Follow-Up: Call back or seek an in-person evaluation if the symptoms worsen or if the condition fails to improve as anticipated.   Virtual Care [phone]  Other Instructions Acute Bronchitis, Adult  Acute bronchitis is sudden inflammation of the main airways (bronchi) that come off the windpipe (trachea) in the lungs. The swelling causes the airways to get smaller and make more mucus than normal. This can make it hard to breathe and can cause coughing or noisy breathing (wheezing). Acute bronchitis may last several weeks. The cough may last longer. Allergies, asthma, and exposure to smoke may make the condition worse. What are the causes? This condition can be caused by germs and by substances that irritate the lungs, including: Cold and flu viruses. The most common cause of this condition is the virus that causes the common cold. Bacteria. This is less common. Breathing in substances that irritate the lungs, including: Smoke from cigarettes and other forms of tobacco.  Dust and pollen. Fumes from household cleaning products, gases, or burned fuel. Indoor or outdoor air pollution. What increases the risk? The following factors may make you more likely to develop this condition: A weak body's defense system, also called the immune system. A condition that affects your lungs and breathing, such as asthma. What are the signs or symptoms? Common symptoms of this condition include: Coughing. This may bring up clear, yellow, or green mucus from your lungs (sputum). Wheezing. Runny or stuffy nose. Having too much mucus in your lungs (chest  congestion). Shortness of breath. Aches and pains, including sore throat or chest. How is this diagnosed? This condition is usually diagnosed based on: Your symptoms and medical history. A physical exam. You may also have other tests, including tests to rule out other conditions, such as pneumonia. These tests include: A test of lung function. Test of a mucus sample to look for the presence of bacteria. Tests to check the oxygen level in your blood. Blood tests. Chest X-ray. How is this treated? Most cases of acute bronchitis clear up over time without treatment. Your health care provider may recommend: Drinking more fluids to help thin your mucus so it is easier to cough up. Taking inhaled medicine (inhaler) to improve air flow in and out of your lungs. Using a vaporizer or a humidifier. These are machines that add water to the air to help you breathe better. Taking a medicine that thins mucus and clears congestion (expectorant). Taking a medicine that prevents or stops coughing (cough suppressant). It is not common to take an antibiotic medicine for this condition. Follow these instructions at home:  Take over-the-counter and prescription medicines only as told by your health care provider. Use an inhaler, vaporizer, or humidifier as told by your health care provider. Take two teaspoons (10 mL) of honey at bedtime to lessen coughing at night. Drink enough fluid to keep your urine pale yellow. Do not use any products that contain nicotine or tobacco. These products include cigarettes, chewing tobacco, and vaping devices, such as e-cigarettes. If you need help quitting, ask your health care provider. Get plenty of rest. Return to your normal activities as told by your health care provider. Ask your health care provider what activities are safe for you. Keep all follow-up visits. This is important. How is this prevented? To lower your risk of getting this condition again: Wash your  hands often with soap and water for at least 20 seconds. If soap and water are not available, use hand sanitizer. Avoid contact with people who have cold symptoms. Try not to touch your mouth, nose, or eyes with your hands. Avoid breathing in smoke or chemical fumes. Breathing smoke or chemical fumes will make your condition worse. Get the flu shot every year. Contact a health care provider if: Your symptoms do not improve after 2 weeks. You have trouble coughing up the mucus. Your cough keeps you awake at night. You have a fever. Get help right away if you: Cough up blood. Feel pain in your chest. Have severe shortness of breath. Faint or keep feeling like you are going to faint. Have a severe headache. Have a fever or chills that get worse. These symptoms may represent a serious problem that is an emergency. Do not wait to see if the symptoms will go away. Get medical help right away. Call your local emergency services (911 in the [place] Do not drive yourself to the hospital. Summary Acute bronchitis is inflammation of the  main airways (bronchi) that come off the windpipe (trachea) in the lungs. The swelling causes the airways to get smaller and make more mucus than normal. Drinking more fluids can help thin your mucus so it is easier to cough up. Take over-the-counter and prescription medicines only as told by your health care provider. Do not use any products that contain nicotine or tobacco. These products include cigarettes, chewing tobacco, and vaping devices, such as e-cigarettes. If you need help quitting, ask your health care provider. Contact a health care provider if your symptoms do not improve after 2 weeks. This information is not intended to replace advice given to you by your health care provider. Make sure you discuss any questions you have with your health care provider. Document Revised: 03/07/2022 Document Reviewed: 03/28/2021 Elsevier Patient Education  2024 Elsevier  Inc.    If you have been instructed to have an in-person evaluation today at a local Urgent Care facility, please use the link below. It will take you to a list of all of our available [facility], including address, phone number and hours of operation. Please do not delay care.  [facility]  If you or a family member do not have a primary care provider, use the link below to schedule a visit and establish care. When you choose a [facility] primary care physician or advanced practice provider, you gain a long-term partner in health. Find a Primary Care Provider  Learn more about [facility]'s in-office and virtual care options:  - Get Care Now

## 2023-12-09 ENCOUNTER — Ambulatory Visit
Admission: RE | Admit: 2023-12-09 | Discharge: 2023-12-09 | Disposition: A | Discharge status: Home / Self Care | Payer: 59 | Source: Ambulatory Visit | Attending: Medical | Admitting: Medical

## 2023-12-09 DIAGNOSIS — I700 Atherosclerosis of aorta: Secondary | ICD-10-CM | POA: Diagnosis not present

## 2023-12-09 DIAGNOSIS — N3289 Other specified disorders of bladder: Secondary | ICD-10-CM | POA: Diagnosis not present

## 2023-12-09 DIAGNOSIS — K5730 Diverticulosis of large intestine without perforation or abscess without bleeding: Secondary | ICD-10-CM | POA: Diagnosis not present

## 2023-12-09 DIAGNOSIS — R109 Unspecified abdominal pain: Secondary | ICD-10-CM

## 2023-12-09 DIAGNOSIS — R140 Abdominal distension (gaseous): Secondary | ICD-10-CM

## 2023-12-09 DIAGNOSIS — K760 Fatty (change of) liver, not elsewhere classified: Secondary | ICD-10-CM | POA: Diagnosis not present

## 2023-12-09 DIAGNOSIS — R39198 Other difficulties with micturition: Secondary | ICD-10-CM

## 2023-12-09 DIAGNOSIS — K439 Ventral hernia without obstruction or gangrene: Secondary | ICD-10-CM | POA: Diagnosis not present

## 2023-12-09 MED ORDER — IOPAMIDOL (ISOVUE-300) INJECTION 61%
200.0000 mL | Freq: Once | INTRAVENOUS | Status: AC | PRN
  Administered 2023-12-09: 100 mL via INTRAVENOUS

## 2023-12-15 NOTE — Progress Notes (Signed)
 Results sent through MyChart

## 2023-12-22 ENCOUNTER — Other Ambulatory Visit: Payer: Self-pay | Admitting: Medical

## 2023-12-22 ENCOUNTER — Ambulatory Visit: Payer: 59 | Admitting: Medical

## 2023-12-22 VITALS — BP 110/70 | HR 91 | Wt 300.4 lb

## 2023-12-22 DIAGNOSIS — E1165 Type 2 diabetes mellitus with hyperglycemia: Secondary | ICD-10-CM | POA: Insufficient documentation

## 2023-12-22 DIAGNOSIS — N401 Enlarged prostate with lower urinary tract symptoms: Secondary | ICD-10-CM | POA: Diagnosis not present

## 2023-12-22 DIAGNOSIS — R350 Frequency of micturition: Secondary | ICD-10-CM | POA: Diagnosis not present

## 2023-12-22 DIAGNOSIS — M2550 Pain in unspecified joint: Secondary | ICD-10-CM

## 2023-12-22 DIAGNOSIS — R6882 Decreased libido: Secondary | ICD-10-CM

## 2023-12-22 DIAGNOSIS — R935 Abnormal findings on diagnostic imaging of other abdominal regions, including retroperitoneum: Secondary | ICD-10-CM

## 2023-12-22 DIAGNOSIS — Z13220 Encounter for screening for lipoid disorders: Secondary | ICD-10-CM

## 2023-12-22 DIAGNOSIS — K760 Fatty (change of) liver, not elsewhere classified: Secondary | ICD-10-CM

## 2023-12-22 DIAGNOSIS — I700 Atherosclerosis of aorta: Secondary | ICD-10-CM

## 2023-12-22 DIAGNOSIS — R5383 Other fatigue: Secondary | ICD-10-CM

## 2023-12-22 LAB — POCT URINALYSIS DIP (PROADVANTAGE DEVICE)
Bilirubin, UA: NEGATIVE
Blood, UA: NEGATIVE
Glucose, UA: NEGATIVE mg/dL
Ketones, POC UA: NEGATIVE mg/dL
Leukocytes, UA: NEGATIVE
Nitrite, UA: NEGATIVE
Protein Ur, POC: 30 mg/dL — AB
Specific Gravity, Urine: 1.02
Urobilinogen, Ur: NEGATIVE
pH, UA: 6 (ref 5.0–8.0)

## 2023-12-22 MED ORDER — TIRZEPATIDE 2.5 MG/0.5ML ~~LOC~~ SOAJ: 2.5000 mg | SUBCUTANEOUS | 0 refills | Status: DC

## 2023-12-22 MED ORDER — TAMSULOSIN HCL 0.4 MG PO CAPS: 0.4000 mg | ORAL_CAPSULE | Freq: Every day | ORAL | 0 refills | Status: DC

## 2023-12-22 MED ORDER — ROSUVASTATIN CALCIUM 20 MG PO TABS: 20.0000 mg | ORAL_TABLET | Freq: Every day | ORAL | 2 refills | Status: DC

## 2023-12-22 NOTE — Progress Notes (Signed)
 Subjective:  Jose [name] is a 58 y.o. male who presents for Chief Complaint  Patient presents with   Consult    Discuss Ct results     Here to discuss CT scan.  I saw him back in November 2024 for abdominal pain, pain at the umbilicus, intermittent pain in burning sensations in the same area.  He does have a history of prior hernia surgery at the umbilicus.  He was also having some urinary changes such as decreased urine stream and frequency.  Regarding diabetes he is compliant with metformin  500 mg twice daily.  I had recommended a medicine such as Mounjaro  back in November to help with weight loss and diabetes control.  We also made a referral for colon cancer screening in November.  He has a new diagnosis of diabetes of late 2024.  As per some of our prior discussions he has ongoing fatigue.  He has not yet come back in for a morning visit for testosterone  blood test.  He also is due for fasting lab for cholesterol which she has not done yet.  He continues to complain joint pains, arthritis in numerous joints.  He saw orthopedics several months ago but did not feel like that was helpful visit.  He walks a lot but does not get to do other types of exercise because of joint pain.  He continues to report low libido  No other aggravating or relieving factors.    No other c/o.  Past Medical History:  Diagnosis Date   Arthritis 2005   knees & wrists ? , meniscus worn - cortisone injection -   Carpal tunnel syndrome 09/26/2015   Bilateral   Chronic pain of both knees 01/18/2019   Ganglion cyst of wrist, right 01/18/2019   Low libido    Obesity    Pain in both wrists 01/18/2019   Sickle cell trait (HCC)    Smoker    Umbilical hernia    Umbilical hernia without obstruction and without gangrene 01/18/2019   Wrist pain    Wrist swelling, unspecified laterality 01/18/2019   Current Outpatient Medications on File Prior to Visit  Medication Sig Dispense Refill   metFORMIN  (GLUCOPHAGE ) 500  MG tablet Take 1 tablet (500 mg total) by mouth 2 (two) times daily with a meal. 60 tablet 2   No current facility-administered medications on file prior to visit.    The following portions of the patient's history were reviewed and updated as appropriate: allergies, current medications, past family history, past medical history, past social history, past surgical history and problem list.  ROS Otherwise as in subjective above    Objective: BP 110/70   Pulse 91   Wt (!) 300 lb 6.4 oz (136.3 kg)   SpO2 98%   BMI 43.10 kg/m   Wt Readings from Last 3 Encounters:  12/22/23 (!) 300 lb 6.4 oz (136.3 kg)  11/04/23 (!) 303 lb 3.2 oz (137.5 kg)  03/24/23 (!) 304 lb 9.6 oz (138.2 kg)    General appearance: alert, no distress, well developed, well nourished Psych: Pleasant, answers questions appropriately Otherwise not examined  12/09/23 CT abdomen and pelvis: IMPRESSION: 1. Ventral abdominal wall hernia repair mesh with a small fat containing hernia along the repair mesh line. 2. Mild symmetric wall thickening of the urinary bladder, correlate with urinalysis to exclude cystitis. 3. Diffuse hepatic steatosis. 4. Colonic diverticulosis without findings of acute diverticulitis. 5.  Aortic Atherosclerosis (ICD10-I70.0).     Assessment: Encounter Diagnoses  Name  Primary?   Type 2 diabetes mellitus with hyperglycemia, without long-term current use of insulin (HCC) Yes   Aortic atherosclerosis (HCC)    Urinary frequency    Benign prostatic hyperplasia with urinary frequency    Low libido    Screening for lipid disorders    Abnormal CT of the abdomen    Fatigue, unspecified type    Polyarthralgia    Hepatic steatosis      Plan: We reviewed his recent CT abdomen pelvis scan which had several findings of concern.  We discussed these as below  Diabetes-if insurance will cover Mounjaro  we will switch from metformin  to Mounjaro  weekly injection.  Continue with working on  healthy diet and regular exercise  Aortic atherosclerosis-we discussed the significance of this on recent scan of abdomen.  Begin rosuvastatin  Crestor  once daily at bedtime.  Discussed risk and benefits and proper use of medication.  Urinary frequency, BPH-recent abnormal CT findings.  Begin trial of Flomax  for likely BPH symptoms although may end up needing to see urology if symptoms persist given the recent CT scan findings  Low libido-return soon for recheck on testosterone  lab.  He has been on testosterone  therapy in the past and will likely benefit from going forward  Return soon for fasting lipid profile  Fatigue-discussed possible differential.  Consider sleep study going forward.  Returns today for testosterone  lab  Polyarthralgia-given the ongoing concerns-referral to rheumatology  Hepatic steatosis-we discussed the significance of this diagnosis, possible complications.  Work on efforts to lose weight through healthy diet and exercise.  We will hopefully get Mounjaro  covered to help with this as well   Jose [name] was seen today for consult.  Diagnoses and all orders for this visit:  Type 2 diabetes mellitus with hyperglycemia, without long-term current use of insulin (HCC)  Aortic atherosclerosis (HCC) -     Lipid panel; Future  Urinary frequency -     POCT Urinalysis DIP (Proadvantage Device)  Benign prostatic hyperplasia with urinary frequency  Low libido -     Testosterone ; Future  Screening for lipid disorders -     Lipid panel; Future  Abnormal CT of the abdomen -     POCT Urinalysis DIP (Proadvantage Device)  Fatigue, unspecified type  Polyarthralgia -     Ambulatory referral to Rheumatology  Hepatic steatosis  Other orders -     tamsulosin  (FLOMAX ) 0.4 MG CAPS capsule; Take 1 capsule (0.4 mg total) by mouth daily. -     tirzepatide  (MOUNJARO ) 2.5 MG/0.5ML Pen; Inject 2.5 mg into the skin once a week. -     rosuvastatin  (CRESTOR ) 20 MG tablet; Take 1  tablet (20 mg total) by mouth daily.    Follow up: For fasting labs, referral

## 2023-12-22 NOTE — Progress Notes (Signed)
 Ok.  He can go and begin the recommendations.  I'll have his chart done in a while

## 2023-12-23 ENCOUNTER — Other Ambulatory Visit: Payer: Self-pay | Admitting: Medical

## 2023-12-23 MED ORDER — TRULICITY 0.75 MG/0.5ML ~~LOC~~ SOAJ: 0.7500 mg | SUBCUTANEOUS | 0 refills | Status: DC

## 2023-12-23 MED ORDER — TRULICITY 1.5 MG/0.5ML ~~LOC~~ SOAJ: 1.5000 mg | SUBCUTANEOUS | 0 refills | Status: DC

## 2023-12-23 NOTE — Telephone Encounter (Signed)
 Pt was notified of results

## 2023-12-25 ENCOUNTER — Telehealth: Payer: Self-pay | Admitting: Medical

## 2023-12-25 NOTE — Telephone Encounter (Signed)
Wife called & states Trulicity is requiring P.A.

## 2023-12-29 ENCOUNTER — Other Ambulatory Visit (HOSPITAL_COMMUNITY): Payer: Self-pay

## 2023-12-29 ENCOUNTER — Telehealth: Payer: Self-pay

## 2023-12-29 NOTE — Telephone Encounter (Signed)
Pharmacy Patient Advocate Encounter   Received notification from Physician's Office that prior authorization for TRULICITY is required/requested.   Insurance verification completed.   The patient is insured through [employer] .   Per test claim: PA required; PA submitted to above mentioned insurance via CoverMyMeds Key/confirmation #/EOC (Key: [id number])   Status is pending

## 2023-12-29 NOTE — Telephone Encounter (Signed)
Pharmacy Patient Advocate Encounter   Received notification from Physician's Office that prior authorization for TRULICTY 0.75MG  is required/requested.   Insurance verification completed.   The patient is insured through [employer] .   Per test claim: PA required; PA submitted to above mentioned insurance via CoverMyMeds Key/confirmation #/EOC (Key: [id number]) Status is pending

## 2023-12-29 NOTE — Telephone Encounter (Signed)
Pharmacy Patient Advocate Encounter  Received notification from [employer] that Prior Authorization for Trulicity 1.5mg   has been APPROVED from 1.20.25 to 1.20.26   Current Rx for Trulicity 0.75mg  is being filled now  PA #/Case ID/Reference #: Key: [id number]

## 2023-12-30 NOTE — Telephone Encounter (Signed)
Pharmacy Patient Advocate Encounter  Received notification from [employer] that Prior Authorization for TRULICTY 0.75 has been APPROVED from 1.20.25 to 1.20.26. Ran test claim, Copay is $25.00. This test claim was processed through [facility]- copay amounts may vary at other pharmacies due to pharmacy/plan contracts, or as the patient moves through the different stages of their insurance plan.   PA #/Case ID/Reference #: (Key: [id number])

## 2024-01-02 ENCOUNTER — Other Ambulatory Visit: Payer: Self-pay | Admitting: Medical

## 2024-01-02 NOTE — Telephone Encounter (Signed)
Wife informed, she said pt has been having some nausea, and hasn't felt really good for couple days, didn't know if was coming down with something or if from new meds, Trulicity & also another doctor but him on prednisone, she things for arthritis.  Advised if worsening sx go to urgent care or E-visit over weekend

## 2024-01-13 ENCOUNTER — Other Ambulatory Visit: Payer: Self-pay | Admitting: *Deleted

## 2024-01-13 ENCOUNTER — Other Ambulatory Visit: Payer: Self-pay | Admitting: Medical

## 2024-01-13 MED ORDER — TRULICITY 0.75 MG/0.5ML ~~LOC~~ SOAJ: 0.7500 mg | SUBCUTANEOUS | 0 refills | Status: DC

## 2024-01-17 ENCOUNTER — Other Ambulatory Visit: Payer: Self-pay | Admitting: Medical

## 2024-01-19 ENCOUNTER — Other Ambulatory Visit: Payer: Self-pay | Admitting: Medical

## 2024-02-14 ENCOUNTER — Other Ambulatory Visit: Payer: Self-pay | Admitting: Medical

## 2024-03-16 ENCOUNTER — Other Ambulatory Visit: Payer: Self-pay | Admitting: Medical

## 2024-03-16 MED ORDER — TRULICITY 1.5 MG/0.5ML ~~LOC~~ SOAJ: 1.5000 mg | SUBCUTANEOUS | 0 refills | Status: DC

## 2024-03-16 NOTE — Telephone Encounter (Signed)
 Spoke with patient and he is doing well on the .75mg  dose. He has lost 20lbs since he started (Jan) and is not having any side effects. Do you want to refill this?

## 2024-04-13 ENCOUNTER — Encounter: Payer: Self-pay | Admitting: Medical

## 2024-04-13 ENCOUNTER — Ambulatory Visit (INDEPENDENT_AMBULATORY_CARE_PROVIDER_SITE_OTHER): Admitting: Medical

## 2024-04-13 VITALS — BP 124/78 | HR 84 | Ht 70.0 in | Wt 288.6 lb

## 2024-04-13 DIAGNOSIS — M2550 Pain in unspecified joint: Secondary | ICD-10-CM

## 2024-04-13 DIAGNOSIS — Z1211 Encounter for screening for malignant neoplasm of colon: Secondary | ICD-10-CM | POA: Diagnosis not present

## 2024-04-13 DIAGNOSIS — E1165 Type 2 diabetes mellitus with hyperglycemia: Secondary | ICD-10-CM

## 2024-04-13 DIAGNOSIS — N401 Enlarged prostate with lower urinary tract symptoms: Secondary | ICD-10-CM

## 2024-04-13 DIAGNOSIS — K760 Fatty (change of) liver, not elsewhere classified: Secondary | ICD-10-CM

## 2024-04-13 DIAGNOSIS — I700 Atherosclerosis of aorta: Secondary | ICD-10-CM | POA: Diagnosis not present

## 2024-04-13 DIAGNOSIS — R350 Frequency of micturition: Secondary | ICD-10-CM

## 2024-04-13 LAB — LIPID PANEL

## 2024-04-13 NOTE — Patient Instructions (Signed)
Ophthalmologists Nearby  [facility] [address], [place], [place] [address] [phone]  [facility] [address], [place], [place] [address] [phone]  [facility] [address], [place], [place] [address] [phone]  [facility]  [address], [place], [place] [address] [phone]  [facility] [address], [place], [place] [address] [phone]  

## 2024-04-13 NOTE — Progress Notes (Signed)
 Subjective:  Jose [name] is a 58 y.o. male who presents for Chief Complaint  Patient presents with   Diabetes    Patient is here for med check, fasting. No new concerns.      Here for med check  Diabetes - compliant with Trulicity 1.5mg  weekly x 2 weeks.  He has questions on how long he may need to stay on this.  Using metformin  500mg  BID.  No polyuria, polydipsia, no blurred vision.   Checking glucose 2 days per week, typically 100-130 fasting in the morning.  No hypoglycemia.     Hyperlipidemia - compliant with Crestor  20mg  .   He sees ortho.  Ongoing pain in wrists, knees.   Been working on his farm, has lots of vegetables planted.   No other aggravating or relieving factors.    No other c/o.  Past Medical History:  Diagnosis Date   Arthritis 2005   knees & wrists ? , meniscus worn - cortisone injection -   Carpal tunnel syndrome 09/26/2015   Bilateral   Chronic pain of both knees 01/18/2019   Ganglion cyst of wrist, right 01/18/2019   Low libido    Obesity    Pain in both wrists 01/18/2019   Sickle cell trait (HCC)    Smoker    Umbilical hernia    Umbilical hernia without obstruction and without gangrene 01/18/2019   Wrist pain    Wrist swelling, unspecified laterality 01/18/2019   Current Outpatient Medications on File Prior to Visit  Medication Sig Dispense Refill   Dulaglutide (TRULICITY) 1.5 MG/0.5ML SOAJ Inject 1.5 mg into the skin once a week. 2 mL 0   metFORMIN  (GLUCOPHAGE ) 500 MG tablet TAKE 1 TABLET BY MOUTH 2 TIMES DAILY WITH A MEAL. 60 tablet 2   Multiple Vitamin (MULTIVITAMIN) tablet Take 1 tablet by mouth daily.     rosuvastatin  (CRESTOR ) 20 MG tablet Take 1 tablet (20 mg total) by mouth daily. 90 tablet 2   tamsulosin  (FLOMAX ) 0.4 MG CAPS capsule TAKE 1 CAPSULE BY MOUTH EVERY DAY 90 capsule 2   Dulaglutide (TRULICITY) 0.75 MG/0.5ML SOAJ INJECT 0.75 MG SUBCUTANEOUSLY ONE TIME PER WEEK (Patient not taking: Reported on 04/13/2024) 2 mL 0   No current  facility-administered medications on file prior to visit.    The following portions of the patient's history were reviewed and updated as appropriate: allergies, current medications, past family history, past medical history, past social history, past surgical history and problem list.  ROS Otherwise as in subjective above    Objective: BP 124/78   Pulse 84   Ht 5\' 10"  (1.778 m)   Wt 288 lb 9.6 oz (130.9 kg)   SpO2 96%   BMI 41.41 kg/m   Wt Readings from Last 3 Encounters:  04/13/24 288 lb 9.6 oz (130.9 kg)  12/22/23 (!) 300 lb 6.4 oz (136.3 kg)  11/04/23 (!) 303 lb 3.2 oz (137.5 kg)   General appearance: alert, no distress, well developed, well nourished Neck: supple, no lymphadenopathy, no thyromegaly, no masses Heart: RRR, normal S1, S2, no murmurs Lungs: CTA bilaterally, no wheezes, rhonchi, or rales Pulses: 2+ radial pulses, 2+ pedal pulses, normal cap refill Ext: no edema  Diabetic Foot Exam - Simple   Simple Foot Form Diabetic Foot exam was performed with the following findings: Yes 04/13/2024 11:34 AM  Visual Inspection See comments: Yes Sensation Testing Intact to touch and monofilament testing bilaterally: Yes Pulse Check Posterior Tibialis and Dorsalis pulse intact bilaterally: Yes Comments [place]  toenails with some hypertrophy      Assessment: Encounter Diagnoses  Name Primary?   Type 2 diabetes mellitus with hyperglycemia, without long-term current use of insulin (HCC) Yes   Screen for colon cancer    Aortic atherosclerosis (HCC)    Hepatic steatosis    Polyarthralgia    Benign prostatic hyperplasia with urinary frequency      Plan: Diabetes-unremarkable, continue Trulicity be increased to 3 mg after he finishes at 1.5 mg weekly.  Continue metformin  for now, 500 mg twice daily.  Continue glucose monitoring.  See eye doctor soon for diabetic eye exam  Aortic atherosclerosis, diabetes-continue Crestor  10 mg daily  Hepatic steatosis-continue with  lose weight through healthy diet and exercise.  BPH-continue Flomax   Referral to GI for first colonoscopy   Loren was seen today for diabetes.  Diagnoses and all orders for this visit:  Type 2 diabetes mellitus with hyperglycemia, without long-term current use of insulin (HCC) -     Hemoglobin A1c -     Microalbumin/Creatinine Ratio, Urine  Screen for colon cancer -     Ambulatory referral to Gastroenterology  Aortic atherosclerosis (HCC) -     Lipid panel  Hepatic steatosis  Polyarthralgia  Benign prostatic hyperplasia with urinary frequency    Follow up: pending labs

## 2024-04-14 ENCOUNTER — Other Ambulatory Visit: Payer: Self-pay | Admitting: Medical

## 2024-04-14 LAB — LIPID PANEL
Cholesterol, Total: 78 mg/dL — ABNORMAL LOW (ref 100–199)
HDL: 37 mg/dL — ABNORMAL LOW (ref >39)
LDL CALC COMMENT:: 2.1 ratio (ref 0.0–5.0)
LDL Chol Calc (NIH): 29 mg/dL (ref 0–99)
Triglycerides: 42 mg/dL (ref 0–149)
VLDL Cholesterol Cal: 12 mg/dL (ref 5–40)

## 2024-04-14 LAB — MICROALBUMIN / CREATININE URINE RATIO
Creatinine, Urine: 81.5 mg/dL
Microalb/Creat Ratio: 28 mg/g{creat} (ref 0–29)
Microalbumin, Urine: 23.1 ug/mL

## 2024-04-14 LAB — HEMOGLOBIN A1C
Est. average glucose Bld gHb Est-mCnc: 111 mg/dL
Hgb A1c MFr Bld: 5.5 % (ref 4.8–5.6)

## 2024-04-14 MED ORDER — ROSUVASTATIN CALCIUM 20 MG PO TABS: 10.0000 mg | ORAL_TABLET | Freq: Every day | ORAL | Status: DC

## 2024-04-14 MED ORDER — TRULICITY 3 MG/0.5ML ~~LOC~~ SOAJ: 3.0000 mg | SUBCUTANEOUS | 2 refills | Status: DC

## 2024-04-14 NOTE — Progress Notes (Signed)
 Cholesterol numbers are fine.   hemoglobin A1c normal 5.5%, microalbumin kidney marker normal.  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

## 2024-04-15 ENCOUNTER — Telehealth: Payer: Self-pay

## 2024-04-15 ENCOUNTER — Other Ambulatory Visit (HOSPITAL_COMMUNITY): Payer: Self-pay

## 2024-04-15 NOTE — Telephone Encounter (Signed)
 Pharmacy Patient Advocate Encounter   Received notification from CoverMyMeds that prior authorization for Trulicity 3MG /0.5ML auto-injectors is required/requested.   Insurance verification completed.   The patient is insured through [employer] .   Per test claim: PA required; PA submitted to above mentioned insurance via CoverMyMeds Key/confirmation #/EOC (Key: [id number])   Status is pending

## 2024-04-16 ENCOUNTER — Other Ambulatory Visit (HOSPITAL_COMMUNITY): Payer: Self-pay

## 2024-04-16 NOTE — Telephone Encounter (Signed)
 Pharmacy Patient Advocate Encounter  Received notification from [employer] that Prior Authorization for Trulicity 3MG /0.5ML auto-injectors has been APPROVED from 5.8.25 to 5.8.26. Ran test claim, Copay is $RTS, RX WAS LAST FILLED ON 04/15/24. This test claim was processed through [facility]- copay amounts may vary at other pharmacies due to pharmacy/plan contracts, or as the patient moves through the different stages of their insurance plan.   PA #/Case ID/Reference #: (Key: [id number])

## 2024-05-01 ENCOUNTER — Other Ambulatory Visit: Payer: Self-pay | Admitting: Medical

## 2024-05-05 ENCOUNTER — Encounter: Payer: Self-pay | Admitting: Medical

## 2024-06-23 ENCOUNTER — Encounter: Payer: Self-pay | Admitting: Pediatrics

## 2024-06-23 ENCOUNTER — Ambulatory Visit (AMBULATORY_SURGERY_CENTER): Payer: Self-pay

## 2024-06-23 VITALS — Ht 70.0 in | Wt 282.0 lb

## 2024-06-23 DIAGNOSIS — Z1211 Encounter for screening for malignant neoplasm of colon: Secondary | ICD-10-CM

## 2024-06-23 MED ORDER — NA SULFATE-K SULFATE-MG SULF 17.5-3.13-1.6 GM/177ML PO SOLN: 1.0000 | Freq: Once | ORAL | 0 refills | Status: AC

## 2024-06-23 NOTE — Progress Notes (Signed)
 No egg or soy allergy known to patient  No issues known to pt with past sedation with any surgeries or procedures Patient denies ever being told they had issues or difficulty with intubation  No FH of Malignant Hyperthermia  Pt is not on diet pills; Takes Trulicity  and hold instructions provided  Pt is not on  home 02  Pt is not on blood thinners  Pt denies issues with constipation  No A fib or A flutter Have any cardiac testing pending--No Pt can ambulate   Pt  uses chewing tobacco, hold instructions provided  Discussed diabetic I weight loss medication holds Discussed NSAID holds Checked BMI Pt instructed to use Singlecare.com or GoodRx for a price reduction on prep  Patient's chart reviewed Pre visit completed

## 2024-06-30 ENCOUNTER — Other Ambulatory Visit: Payer: Self-pay | Admitting: Medical

## 2024-07-11 NOTE — Progress Notes (Unsigned)
 [facility] History and Physical   Primary Care Physician:  Bulah Alm RAMAN, PA-C   Reason for Procedure:  Colorectal cancer screening  Plan:    Screening colonoscopy     HPI: [name] HAYASHI is a 58 y.o. male undergoing screening colonoscopy for colorectal cancer screening.  This is the patient's first colonoscopy.  No documented family history of colorectal cancer or polyps.  Patient denies current symptoms of change in bowel habits or rectal bleeding at the time of this exam.   Past Medical History:  Diagnosis Date   Arthritis 2005   knees & wrists ? , meniscus worn - cortisone injection -   Carpal tunnel syndrome 09/26/2015   Bilateral   Chronic pain of both knees 01/18/2019   Diabetes mellitus without complication (HCC)    Ganglion cyst of wrist, right 01/18/2019   Low libido    Obesity    Pain in both wrists 01/18/2019   Sickle cell trait (HCC)    Smoker    Umbilical hernia    Umbilical hernia without obstruction and without gangrene 01/18/2019   Wrist pain    Wrist swelling, unspecified laterality 01/18/2019    Past Surgical History:  Procedure Laterality Date   calcium  deposit Left    Left arm- calcium  deposit removed    UMBILICAL HERNIA REPAIR N/A 02/04/2019   Procedure: UMBILICAL HERNIA REPAIR WITH MESH;  Surgeon: Vanderbilt Ned, MD;  Location: [facility] OR;  Service: General;  Laterality: N/A;    Prior to Admission medications   Medication Sig Start Date End Date Taking? Authorizing Provider  Dulaglutide  (TRULICITY ) 3 MG/0.5ML SOAJ INJECT 1 PEN INTO SKIN AS DIRECTED ONCE A WEEK 06/30/24   Tysinger, Alm RAMAN, PA-C  Multiple Vitamin (MULTIVITAMIN) tablet Take 1 tablet by mouth daily.    [provider]  rosuvastatin  (CRESTOR ) 20 MG tablet Take 0.5 tablets (10 mg total) by mouth daily. 04/14/24   Tysinger, Alm RAMAN, PA-C  rosuvastatin  (CRESTOR ) 20 MG tablet Take 20 mg by mouth daily.    [provider]  tamsulosin  (FLOMAX ) 0.4 MG CAPS  capsule TAKE 1 CAPSULE BY MOUTH EVERY DAY 01/19/24   Tysinger, Alm RAMAN, PA-C    Current Outpatient Medications  Medication Sig Dispense Refill   Dulaglutide  (TRULICITY ) 3 MG/0.5ML SOAJ INJECT 1 PEN INTO SKIN AS DIRECTED ONCE A WEEK 2 mL 1   Multiple Vitamin (MULTIVITAMIN) tablet Take 1 tablet by mouth daily.     rosuvastatin  (CRESTOR ) 20 MG tablet Take 0.5 tablets (10 mg total) by mouth daily.     rosuvastatin  (CRESTOR ) 20 MG tablet Take 20 mg by mouth daily.     tamsulosin  (FLOMAX ) 0.4 MG CAPS capsule TAKE 1 CAPSULE BY MOUTH EVERY DAY 90 capsule 2   No current facility-administered medications for this visit.    Allergies as of 07/12/2024   (No Known Allergies)    Family History  Problem Relation Age of Onset   Cancer Father    CAD Father    Colon cancer Neg Hx    Rectal cancer Neg Hx    Stomach cancer Neg Hx    Esophageal cancer Neg Hx     Social History   Socioeconomic History   Marital status: Married    Spouse name: Not on file   Number of children: Not on file   Years of education: Not on file   Highest education level: Not on file  Occupational History   Not on file  Tobacco Use   Smoking status:  Every Day    Current packs/day: 0.75    Types: Cigarettes   Smokeless tobacco: Current    Types: Chew  Vaping Use   Vaping status: Never Used  Substance and Sexual Activity   Alcohol use: Not Currently    Alcohol/week: 0.0 standard drinks of alcohol   Drug use: Not Currently   Sexual activity: Not on file  Other Topics Concern   Not on file  Social History Narrative   Married, 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  Physical Exam: Vital signs There were no vitals taken for this visit.  General:   Alert,  Well-developed, well-nourished, pleasant and cooperative in NAD Airway:  Mallampati  Lungs:  Clear throughout to auscultation.   Heart:  Regular rate and rhythm; no murmurs, clicks, rubs,  or gallops. Abdomen:  Soft, nontender and nondistended. Normal bowel sounds.   Neuro/Psych:  Normal mood and affect. A and O x 3  Inocente Hausen, MD [facility]

## 2024-07-12 ENCOUNTER — Ambulatory Visit (AMBULATORY_SURGERY_CENTER): Payer: Self-pay | Admitting: Pediatrics

## 2024-07-12 ENCOUNTER — Encounter: Payer: Self-pay | Admitting: Pediatrics

## 2024-07-12 VITALS — BP 104/77 | HR 74 | Temp 98.0°F | Resp 16 | Ht 70.0 in | Wt 282.0 lb

## 2024-07-12 DIAGNOSIS — D128 Benign neoplasm of rectum: Secondary | ICD-10-CM | POA: Diagnosis not present

## 2024-07-12 DIAGNOSIS — D124 Benign neoplasm of descending colon: Secondary | ICD-10-CM

## 2024-07-12 DIAGNOSIS — D123 Benign neoplasm of transverse colon: Secondary | ICD-10-CM | POA: Diagnosis not present

## 2024-07-12 DIAGNOSIS — D122 Benign neoplasm of ascending colon: Secondary | ICD-10-CM | POA: Diagnosis not present

## 2024-07-12 DIAGNOSIS — Z1211 Encounter for screening for malignant neoplasm of colon: Secondary | ICD-10-CM

## 2024-07-12 DIAGNOSIS — K648 Other hemorrhoids: Secondary | ICD-10-CM

## 2024-07-12 MED ORDER — SODIUM CHLORIDE 0.9 % IV SOLN: 500.0000 mL | Freq: Once | INTRAVENOUS | Status: DC

## 2024-07-12 NOTE — Patient Instructions (Addendum)
 Await pathology results.                           - Repeat colonoscopy for surveillance based on                            pathology results.                           - The findings and recommendations were discussed                            with the patient's family.                           - Return to referring physician.                           - Patient has a contact number available for                            emergencies. The signs and symptoms of potential                            delayed complications were discussed with the                            patient. Return to normal activities tomorrow.                            Written discharge instructions were provided to the                            patient. Handout on polyps given.      YOU HAD AN ENDOSCOPIC PROCEDURE TODAY AT THE [facility]:   Refer to the procedure report that was given to you for any specific questions about what was found during the examination.  If the procedure report does not answer your questions, please call your gastroenterologist to clarify.  If you requested that your care partner not be given the details of your procedure findings, then the procedure report has been included in a sealed envelope for you to review at your convenience later.  YOU SHOULD EXPECT: Some feelings of bloating in the abdomen. Passage of more gas than usual.  Walking can help get rid of the air that was put into your GI tract during the procedure and reduce the bloating. If you had a lower endoscopy (such as a colonoscopy or flexible sigmoidoscopy) you may notice spotting of blood in your stool or on the toilet paper. If you underwent a bowel prep for your procedure, you may not have a normal bowel movement for a few days.  Please Note:  You might notice some irritation and congestion in your nose or some drainage.  This is from the oxygen used during your procedure.  There is no need for concern and it  should clear up in a day or so.  SYMPTOMS TO REPORT IMMEDIATELY:  Following lower endoscopy (colonoscopy or flexible sigmoidoscopy):  Excessive amounts  of blood in the stool  Significant tenderness or worsening of abdominal pains  Swelling of the abdomen that is new, acute  Fever of 100F or higher   For urgent or emergent issues, a gastroenterologist can be reached at any hour by calling [phone]. Do not use MyChart messaging for urgent concerns.    DIET:  We do recommend a small meal at first, but then you may proceed to your regular diet.  Drink plenty of fluids but you should avoid alcoholic beverages for 24 hours.  ACTIVITY:  You should plan to take it easy for the rest of today and you should NOT DRIVE or use heavy machinery until tomorrow (because of the sedation medicines used during the test).    FOLLOW UP: Our staff will call the number listed on your records the next business day following your procedure.  We will call around 7:15- 8:00 am to check on you and address any questions or concerns that you may have regarding the information given to you following your procedure. If we do not reach you, we will leave a message.     If any biopsies were taken you will be contacted by phone or by letter within the next 1-3 weeks.  Please call us  at [phone] if you have not heard about the biopsies in 3 weeks.    SIGNATURES/CONFIDENTIALITY: You and/or your care partner have signed paperwork which will be entered into your electronic medical record.  These signatures attest to the fact that that the information above on your After Visit Summary has been reviewed and is understood.  Full responsibility of the confidentiality of this discharge information lies with you and/or your care-partner.

## 2024-07-12 NOTE — Progress Notes (Signed)
 Pt's states no medical or surgical changes since previsit or office visit.

## 2024-07-12 NOTE — Progress Notes (Signed)
 Pt resting comfortably. VSS. Airway intact. SBAR complete to RN. All questions answered.

## 2024-07-12 NOTE — Op Note (Signed)
 [facility] Patient Name: Jose [name] Procedure Date: 07/12/2024 10:38 AM MRN: [id number] Endoscopist: Inocente Hausen , MD, [id number] Age: 58 Referring MD:  Date of Birth: 01-May-1966 Gender: Male Account #: [id number] Procedure:                Colonoscopy Indications:              Screening for colorectal malignant neoplasm, This                            is the patient's first colonoscopy Medicines:                Monitored Anesthesia Care Procedure:                Pre-Anesthesia Assessment:                           - Prior to the procedure, a History and Physical                            was performed, and patient medications and                            allergies were reviewed. The patient's tolerance of                            previous anesthesia was also reviewed. The risks                            and benefits of the procedure and the sedation                            options and risks were discussed with the patient.                            All questions were answered, and informed consent                            was obtained. Prior Anticoagulants: The patient has                            taken no anticoagulant or antiplatelet agents. ASA                            Grade Assessment: II - A patient with mild systemic                            disease. After reviewing the risks and benefits,                            the patient was deemed in satisfactory condition to                            undergo the procedure.  After obtaining informed consent, the colonoscope                            was passed under direct vision. Throughout the                            procedure, the patient's blood pressure, pulse, and                            oxygen saturations were monitored continuously. The                            Olympus Scope SN [id number] was introduced through the                            anus and advanced to the  terminal ileum. The                            colonoscopy was performed without difficulty. The                            patient tolerated the procedure well. The quality                            of the bowel preparation was adequate; lavage with                            sterile water was needed to remove residual stool                            in the ascending, transverse and descending colon.                            The terminal ileum, ileocecal valve, appendiceal                            orifice, and rectum were photographed. Scope In: 10:50:15 AM Scope Out: 11:11:27 AM Scope Withdrawal Time: 0 hours 18 minutes 23 seconds  Total Procedure Duration: 0 hours 21 minutes 12 seconds  Findings:                 The perianal and digital rectal examinations were                            normal. Pertinent negatives include normal                            sphincter tone and no palpable rectal lesions.                           Two sessile polyps were found in the descending                            colon and hepatic flexure. The polyps were  5 to 7                            mm in size. These polyps were removed with a cold                            snare. Resection and retrieval were complete.                           Three sessile polyps were found in the rectum (1)                            and ascending colon (2). The polyps were 3 to 4 mm                            in size. These polyps were removed with a cold                            biopsy forceps. Resection and retrieval were                            complete.                           Internal hemorrhoids were found during retroflexion. Complications:            No immediate complications. Estimated blood loss:                            Minimal. Estimated Blood Loss:     Estimated blood loss was minimal. Impression:               - Two 5 to 7 mm polyps in the descending colon and                            at  the hepatic flexure, removed with a cold snare.                            Resected and retrieved.                           - Three 3 to 4 mm polyps in the rectum and in the                            ascending colon, removed with a cold biopsy                            forceps. Resected and retrieved.                           - Internal hemorrhoids. Recommendation:           - Discharge patient to home (ambulatory).                           -  Await pathology results.                           - Repeat colonoscopy for surveillance based on                            pathology results.                           - The findings and recommendations were discussed                            with the patient's family.                           - Return to referring physician.                           - Patient has a contact number available for                            emergencies. The signs and symptoms of potential                            delayed complications were discussed with the                            patient. Return to normal activities tomorrow.                            Written discharge instructions were provided to the                            patient. Inocente Hausen, MD 07/12/2024 11:18:47 AM This report has been signed electronically.

## 2024-07-12 NOTE — Progress Notes (Signed)
 Called to room to assist during endoscopic procedure.  Patient ID and intended procedure confirmed with present staff. Received instructions for my participation in the procedure from the performing physician.

## 2024-07-13 ENCOUNTER — Telehealth: Payer: Self-pay | Admitting: *Deleted

## 2024-07-13 NOTE — Telephone Encounter (Signed)
  Follow up Call-     07/12/2024   10:02 AM  Call back number  Post procedure Call Back phone  # [phone]  Permission to leave phone message Yes     Patient questions: Mailbox is full.

## 2024-07-14 LAB — SURGICAL PATHOLOGY

## 2024-07-15 ENCOUNTER — Ambulatory Visit: Payer: Self-pay | Admitting: Pediatrics

## 2024-07-20 ENCOUNTER — Ambulatory Visit: Admitting: Medical

## 2024-07-27 ENCOUNTER — Ambulatory Visit: Payer: Self-pay | Admitting: Medical

## 2024-07-27 ENCOUNTER — Encounter: Payer: Self-pay | Admitting: Medical

## 2024-07-27 ENCOUNTER — Other Ambulatory Visit: Payer: Self-pay | Admitting: Medical

## 2024-07-27 VITALS — BP 138/80 | HR 78 | Ht 70.0 in | Wt 282.0 lb

## 2024-07-27 DIAGNOSIS — I700 Atherosclerosis of aorta: Secondary | ICD-10-CM | POA: Diagnosis not present

## 2024-07-27 DIAGNOSIS — R052 Subacute cough: Secondary | ICD-10-CM | POA: Diagnosis not present

## 2024-07-27 DIAGNOSIS — F17200 Nicotine dependence, unspecified, uncomplicated: Secondary | ICD-10-CM

## 2024-07-27 DIAGNOSIS — R062 Wheezing: Secondary | ICD-10-CM

## 2024-07-27 DIAGNOSIS — K760 Fatty (change of) liver, not elsewhere classified: Secondary | ICD-10-CM

## 2024-07-27 DIAGNOSIS — Z6841 Body Mass Index (BMI) 40.0 and over, adult: Secondary | ICD-10-CM

## 2024-07-27 DIAGNOSIS — E1165 Type 2 diabetes mellitus with hyperglycemia: Secondary | ICD-10-CM | POA: Diagnosis not present

## 2024-07-27 MED ORDER — TRELEGY ELLIPTA 100-62.5-25 MCG/ACT IN AEPB: 1.0000 | INHALATION_SPRAY | Freq: Every day | RESPIRATORY_TRACT | Status: DC

## 2024-07-27 MED ORDER — BENZONATATE 200 MG PO CAPS: 200.0000 mg | ORAL_CAPSULE | Freq: Three times a day (TID) | ORAL | 0 refills | Status: DC | PRN

## 2024-07-27 MED ORDER — CETIRIZINE HCL 10 MG PO TABS: 10.0000 mg | ORAL_TABLET | Freq: Every day | ORAL | Status: DC

## 2024-07-27 MED ORDER — TIRZEPATIDE 12.5 MG/0.5ML ~~LOC~~ SOAJ: 12.5000 mg | SUBCUTANEOUS | 0 refills | Status: DC

## 2024-07-27 MED ORDER — ALBUTEROL SULFATE HFA 108 (90 BASE) MCG/ACT IN AERS: 2.0000 | INHALATION_SPRAY | Freq: Four times a day (QID) | RESPIRATORY_TRACT | 0 refills | Status: DC | PRN

## 2024-07-27 MED ORDER — OMEPRAZOLE 40 MG PO CPDR: 40.0000 mg | DELAYED_RELEASE_CAPSULE | Freq: Every day | ORAL | 0 refills | Status: DC

## 2024-07-27 NOTE — Progress Notes (Signed)
 Subjective:  Jose [name] is a 58 y.o. male who presents for Chief Complaint  Patient presents with   Follow-up    Wheezing and coughing for two weeks     Here for coughing and chest congestion in chest x 2 weeks.  No fever, no body aches or chills, doesn't feel particularly sick.  Coughing, wheezing.   Does have congestion.   Taking mucinex , nyquil and dayquil.  Doesn't normally have allergy problems.   Has had some mild GERD/heartburn recently.     He is a smoker. Used to smoke 1ppd.  Now <1/2 ppd.  Has smoked 12 years.  No hx/o asthma.  Wife doesn't smoke but she has similar cough currently.  No prior use of inhaler.  No sore throat, no ear pain.   No NVD.    Diabetes - glucose at home 117-120s fasting.  Compliant with Trulicity .  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  Was seeing ortho, having injections in knees, but it wasn't helping.  Hyperlipidemia - compliant with crestor  without c/o  He notes that he had recently colonoscopy  No other aggravating or relieving factors.    No other c/o.  Past Medical History:  Diagnosis Date   Arthritis 2005   knees & wrists ? , meniscus worn - cortisone injection -   Carpal tunnel syndrome 09/26/2015   Bilateral   Chronic pain of both knees 01/18/2019   Diabetes mellitus without complication (HCC)    Ganglion cyst of wrist, right 01/18/2019   Low libido    Obesity    Pain in both wrists 01/18/2019   Sickle cell trait (HCC)    Smoker    Umbilical hernia    Umbilical hernia without obstruction and without gangrene 01/18/2019   Wrist pain    Wrist swelling, unspecified laterality 01/18/2019   Current Outpatient Medications on File Prior to Visit  Medication Sig Dispense Refill   Dulaglutide  (TRULICITY ) 3 MG/0.5ML SOAJ INJECT 1 PEN INTO SKIN AS DIRECTED ONCE A WEEK 2 mL 1   Multiple Vitamin (MULTIVITAMIN) tablet Take 1 tablet by mouth daily.     rosuvastatin  (CRESTOR ) 20 MG tablet Take 0.5 tablets (10 mg total)  by mouth daily.     tamsulosin  (FLOMAX ) 0.4 MG CAPS capsule TAKE 1 CAPSULE BY MOUTH EVERY DAY 90 capsule 2   rosuvastatin  (CRESTOR ) 20 MG tablet Take 20 mg by mouth daily.     No current facility-administered medications on file prior to visit.     The following portions of the patient's history were reviewed and updated as appropriate: allergies, current medications, past family history, past medical history, past social history, past surgical history and problem list.  ROS Otherwise as in subjective above    Objective: BP 138/80 (BP Location: Left Arm, Patient Position: Sitting)   Pulse 78   Ht 5' 10 (1.778 m)   Wt 282 lb (127.9 kg)   SpO2 98%   BMI 40.46 kg/m   Wt Readings from Last 3 Encounters:  07/27/24 282 lb (127.9 kg)  07/12/24 282 lb (127.9 kg)  06/23/24 282 lb (127.9 kg)   BP Readings from Last 3 Encounters:  07/27/24 138/80  07/12/24 104/77  04/13/24 124/78   General appearance: alert, no distress, well developed, well nourished HEENT: normocephalic, sclerae anicteric, conjunctiva pink and moist, TMs pearly, nares patent, no discharge or erythema, pharynx with mild erythema Oral cavity: MMM, no lesions Neck: supple, no lymphadenopathy, no thyromegaly, no masses Heart: RRR, normal S1,  S2, no murmurs Lungs: scattered wheezes, no rhonchi, or rales Pulses: 2+ radial pulses, 2+ pedal pulses, normal cap refill Ext: no edema    Assessment: Encounter Diagnoses  Name Primary?   Type 2 diabetes mellitus with hyperglycemia, without long-term current use of insulin (HCC) Yes   Hepatic steatosis    Aortic atherosclerosis (HCC)    Subacute cough    Wheezing    Smoker    BMI 40.0-44.9, adult (HCC)      Plan: Wheezing and cough You do not appear to be sick.  Triggers could be acid reflux and allergies We gave you a breathing treatment today with albuterol  nebulized.   Go for baseline chest x-ray Begin albuterol  inhaler 1 to 2 puffs 3 times a day for the  next week to help with wheezing and cough Begin sample of Trelegy daily maintenance inhaler, 1 puff daily for 1-2 weeks.  This has other medication to calm down inflammation in the lung. Begin samples of Zyrtec  once daily at bedtime for allergies.  Do this for the next week Begin omeprazole  acid reflux medicine first thing in the morning 30 minutes before breakfast for the next week You can continue Mucinex  for the next week as well for mucous I prescribed Tessalon  Perles cough drops for as needed use. If not much improved by Friday in the next 3 days let me know  Please go to [facility] for your chest xray.   Their hours are 8am - 4:30 pm Monday - Friday.  Take your insurance card with you.  [facility] [phone]  [address] [place], [place] [address]   Smoking-I recommend you quit smoking completely   Diabetes type 2 Continue glucose monitoring If insurance will cover this let switch from Trulicity  to Mounjaro  After 1 month on Mounjaro  12.5 mg, call back so I can send in the highest dose 15 mg  Obesity, fatty liver disease Try to get exercise at least 150 minutes a week or at least 4 to 5 days/week.  Cut out calories where possible.  Lets try to lose some more weight  High cholesterol, aortic atherosclerosis-continue Crestor  10 mg daily   Jose [name] was seen today for follow-up.  Diagnoses and all orders for this visit:  Type 2 diabetes mellitus with hyperglycemia, without long-term current use of insulin (HCC)  Hepatic steatosis  Aortic atherosclerosis (HCC)  Subacute cough -     DG Chest 2 View; Future  Wheezing -     DG Chest 2 View; Future  Smoker -     DG Chest 2 View; Future  BMI 40.0-44.9, adult (HCC)  Other orders -     tirzepatide  (MOUNJARO ) 12.5 MG/0.5ML Pen; Inject 12.5 mg into the skin once a week. -     albuterol  (VENTOLIN  HFA) 108 (90 Base) MCG/ACT inhaler; Inhale 2 puffs into the lungs every 6 (six) hours as needed for wheezing  or shortness of breath. -     omeprazole  (PRILOSEC) 40 MG capsule; Take 1 capsule (40 mg total) by mouth daily. -     cetirizine  (ZYRTEC ) 10 MG tablet; Take 1 tablet (10 mg total) by mouth at bedtime. -     benzonatate  (TESSALON ) 200 MG capsule; Take 1 capsule (200 mg total) by mouth 3 (three) times daily as needed for cough. -     Fluticasone -Umeclidin-Vilant (TRELEGY ELLIPTA ) 100-62.5-25 MCG/ACT AEPB; Inhale 1 puff into the lungs daily.   Follow up: 2 to 3 months

## 2024-07-27 NOTE — Patient Instructions (Signed)
 Wheezing and cough You do not appear to be sick.  Triggers could be acid reflux and allergies We gave you a breathing treatment today with albuterol  nebulized.  He responded well to this. Go for baseline chest x-ray Begin albuterol  inhaler 1 to 2 puffs 3 times a day for the next week to help with wheezing and cough Begin sample of Trelegy daily maintenance inhaler, 1 puff daily for 1-2 weeks.  This has other medication to calm down inflammation in the lung. Begin samples of Zyrtec  once daily at bedtime for allergies.  Do this for the next week Begin omeprazole  acid reflux medicine first thing in the morning 30 minutes before breakfast for the next week You can continue Mucinex  for the next week as well for mucous I prescribed Tessalon  Perles cough drops for as needed use. If not much improved by Friday in the next 3 days let me know  Please go to [facility] for your chest xray.   Their hours are 8am - 4:30 pm Monday - Friday.  Take your insurance card with you.  [facility] [phone]  [address] [place], [place] [address]   Smoking-I recommend you quit smoking completely   Diabetes type 2 Continue glucose monitoring If insurance will cover this let switch from Trulicity  to Mounjaro  After 1 month on Mounjaro  12.5 mg, call back so I can send in the highest dose 15 mg  Obesity, fatty liver disease Try to get exercise at least 150 minutes a week or at least 4 to 5 days/week.  Cut out calories where possible.  Lets try to lose some more weight  High cholesterol, aortic atherosclerosis-continue Crestor  10 mg daily

## 2024-07-29 ENCOUNTER — Other Ambulatory Visit (HOSPITAL_COMMUNITY): Payer: Self-pay

## 2024-07-30 ENCOUNTER — Telehealth: Payer: Self-pay

## 2024-07-30 NOTE — Telephone Encounter (Signed)
 Pharmacy Patient Advocate Encounter  Received notification from [employer] that Prior Authorization for Mounjaro  12.5MG /0.5ML auto-injector has been APPROVED from 8.21.25 to 8.21.26. Ran test claim, . This test claim was processed through [facility]- copay amounts may vary at other pharmacies due to pharmacy/plan contracts, or as the patient moves through the different stages of their insurance plan.   PA #/Case ID/Reference #: [id number]

## 2024-08-18 ENCOUNTER — Other Ambulatory Visit: Payer: Self-pay | Admitting: Medical

## 2024-09-06 ENCOUNTER — Other Ambulatory Visit: Payer: Self-pay | Admitting: Medical

## 2024-09-06 DIAGNOSIS — G8929 Other chronic pain: Secondary | ICD-10-CM

## 2024-09-06 NOTE — Telephone Encounter (Unsigned)
 Copied from CRM [id number]. Topic: Referral - Request for Referral >> Sep 06, 2024 10:59 AM Debby BROCKS wrote: Did the patient discuss referral with their provider in the last year? No (If No - schedule appointment) (If Yes - send message)  Appointment offered? No, Patient states that the PCP is already aware of the situation and they are ready to move forward with a referral to have the knees looked into   Type of order/referral and detailed reason for visit: Patient Is having issues with his knees  Preference of office, provider, location: N/A, does not have anything in mind  If referral order, have you been seen by this specialty before? No (If Yes, this issue or another issue? When? Where?  Can we respond through MyChart? No, phone call preferred

## 2024-09-11 ENCOUNTER — Other Ambulatory Visit: Payer: Self-pay | Admitting: Medical

## 2024-09-13 NOTE — Telephone Encounter (Signed)
 Per lab note in 04/2024 pt was decreased to 10mg 

## 2024-09-16 ENCOUNTER — Ambulatory Visit
Admission: EM | Admit: 2024-09-16 | Discharge: 2024-09-16 | Disposition: A | Discharge status: Home / Self Care | Attending: Physician Assistant | Admitting: Physician Assistant

## 2024-09-16 ENCOUNTER — Other Ambulatory Visit: Payer: Self-pay

## 2024-09-16 DIAGNOSIS — M5441 Lumbago with sciatica, right side: Secondary | ICD-10-CM

## 2024-09-16 LAB — POCT URINE DIPSTICK
Bilirubin, UA: NEGATIVE
Blood, UA: NEGATIVE
Glucose, UA: NEGATIVE mg/dL
Ketones, POC UA: NEGATIVE mg/dL
Leukocytes, UA: NEGATIVE
Nitrite, UA: NEGATIVE
POC PROTEIN,UA: NEGATIVE
Spec Grav, UA: 1.015 (ref 1.010–1.025)
Urobilinogen, UA: 0.2 U/dL
pH, UA: 5.5 (ref 5.0–8.0)

## 2024-09-16 MED ORDER — MELOXICAM 15 MG PO TABS: 15.0000 mg | ORAL_TABLET | Freq: Every day | ORAL | 0 refills | Status: DC

## 2024-09-16 MED ORDER — KETOROLAC TROMETHAMINE 30 MG/ML IJ SOLN
30.0000 mg | Freq: Once | INTRAMUSCULAR | Status: AC
  Administered 2024-09-16: 30 mg via INTRAMUSCULAR

## 2024-09-16 NOTE — ED Notes (Signed)
 Went and checked on pt at this time. Still cannot provide urine sample. Pt has been provided 6 small water bottles in total.

## 2024-09-16 NOTE — Discharge Instructions (Addendum)
 VISIT SUMMARY:  You came in today because of lower back pain that radiates to your right leg. You have been experiencing this pain for the past four days, and it gets worse when you stand up. You also mentioned ongoing knee problems and your well-controlled diabetes. Your urine was normal and did not show any evidence of UTI or blood in the urine   YOUR PLAN:  -LOW BACK PAIN, RIGHT-SIDED PREDOMINANT: This is acute pain in your lower back that radiates to your right leg, likely due to musculoskeletal strain or inflammation. We gave you a Toradol  injection to help with the pain and inflammation. You will also start taking meloxicam daily to manage the pain and inflammation. Additionally, we provided you with stretching exercises to prevent stiffness. We will obtain a urine sample to rule out any urinary causes of the pain.  -BILATERAL KNEE OSTEOARTHRITIS: This is a chronic condition where the cartilage in your knee joints wears down, causing pain and stiffness. You have an upcoming orthopedic appointment to discuss potential treatment options, including knee replacement surgery.   INSTRUCTIONS:  Please follow up with your orthopedic appointment to discuss your knee pain and potential treatment options. Make sure to take the meloxicam as prescribed and perform the stretching exercises daily. We will contact you with the results of your urine sample.

## 2024-09-16 NOTE — ED Provider Notes (Signed)
 [facility]    CSN: [id number] Arrival date & time: 09/16/24  1341      History   Chief Complaint Chief Complaint  Patient presents with   Back Pain    HPI Jose [name] is a 58 y.o. male.  has a past medical history of Arthritis (2005), Carpal tunnel syndrome (09/26/2015), Chronic pain of both knees (01/18/2019), Diabetes mellitus without complication (HCC), Ganglion cyst of wrist, right (01/18/2019), Low libido, Obesity, Pain in both wrists (01/18/2019), Sickle cell trait, Smoker, Umbilical hernia, Umbilical hernia without obstruction and without gangrene (01/18/2019), Wrist pain, and Wrist swelling, unspecified laterality (01/18/2019).   HPI  Discussed the use of AI scribe software for clinical note transcription with the patient, who gave verbal consent to proceed. The patient presents with lower back pain radiating to the right leg.  He has been experiencing lower back pain for the past four days, which radiates down his right leg. The pain is described as shooting and occurs when he stands up. It is primarily located on the right side. There have been no recent injuries to his back, and he has not been engaging in heavy lifting or repetitive motions recently.  He reports no pain during urination, difficulty urinating, increased frequency, fever, or chills. However, he mentions a lack of sensation during urination, which has been present for a while, but he still feels the urge to urinate and has not experienced incontinence.  He has tried heat and ice for the back pain without relief. He took two Tylenol  earlier today and a muscle relaxer about four hours ago, which did not provide relief or cause drowsiness. He has not taken ibuprofen  recently, as it no longer seems effective.  He has a history of knee problems and is considering knee replacement surgery. He experiences a burning sensation in his knees and the back of his legs, which he describes as feeling like 'Icy  Hot'. He has used Advil  in the past, which was effective but no longer helps.  He has diabetes, which he states is well-controlled, but he does not know his last A1c value. He stopped receiving an infusion treatment three months ago as it did not seem to help and was planning to have something done with his knees.  He is self-employed and has been out of work for a week and a half due to knee issues.   A1c of 5.5 per results from 04/13/24 eGFR of 91 per results from 11/04/23   Past Medical History:  Diagnosis Date   Arthritis 2005   knees & wrists ? , meniscus worn - cortisone injection -   Carpal tunnel syndrome 09/26/2015   Bilateral   Chronic pain of both knees 01/18/2019   Diabetes mellitus without complication (HCC)    Ganglion cyst of wrist, right 01/18/2019   Low libido    Obesity    Pain in both wrists 01/18/2019   Sickle cell trait    Smoker    Umbilical hernia    Umbilical hernia without obstruction and without gangrene 01/18/2019   Wrist pain    Wrist swelling, unspecified laterality 01/18/2019    Patient Active Problem List   Diagnosis Date Noted   Smoker 07/27/2024   BMI 40.0-44.9, adult (HCC) 07/27/2024   Aortic atherosclerosis 12/22/2023   Benign prostatic hyperplasia with urinary frequency 12/22/2023   Urinary frequency 12/22/2023   Screening for lipid disorders 12/22/2023   Abnormal CT of the abdomen 12/22/2023   Fatigue 12/22/2023   Polyarthralgia  12/22/2023   Hepatic steatosis 12/22/2023   Type 2 diabetes mellitus with hyperglycemia, without long-term current use of insulin (HCC) 12/22/2023   Umbilical hernia without obstruction and without gangrene 01/18/2019   Wrist swelling, unspecified laterality 01/18/2019   Pain in both wrists 01/18/2019   Ganglion cyst of wrist, right 01/18/2019   Preop examination 01/18/2019   Obesity 01/18/2019   Low libido 01/18/2019   Chronic pain of both knees 01/18/2019   Carpal tunnel syndrome 09/26/2015    Past  Surgical History:  Procedure Laterality Date   calcium  deposit Left    Left arm- calcium  deposit removed    UMBILICAL HERNIA REPAIR N/A 02/04/2019   Procedure: UMBILICAL HERNIA REPAIR WITH MESH;  Surgeon: Vanderbilt Ned, MD;  Location: [facility] OR;  Service: General;  Laterality: N/A;       Home Medications    Prior to Admission medications   Medication Sig Start Date End Date Taking? Authorizing Provider  meloxicam (MOBIC) 15 MG tablet Take 1 tablet (15 mg total) by mouth daily. 09/16/24  Yes Jazzalyn Loewenstein E, PA-C  albuterol  (VENTOLIN  HFA) 108 (90 Base) MCG/ACT inhaler Inhale 2 puffs into the lungs every 6 (six) hours as needed for wheezing or shortness of breath. 07/27/24   Tysinger, Alm RAMAN, PA-C  benzonatate  (TESSALON ) 200 MG capsule Take 1 capsule (200 mg total) by mouth 3 (three) times daily as needed for cough. 07/27/24   Tysinger, Alm RAMAN, PA-C  cetirizine  (ZYRTEC ) 10 MG tablet Take 1 tablet (10 mg total) by mouth at bedtime. 07/27/24   Tysinger, Alm RAMAN, PA-C  Dulaglutide  (TRULICITY ) 3 MG/0.5ML SOAJ INJECT 1 PEN INTO SKIN AS DIRECTED ONCE A WEEK 06/30/24   Tysinger, Alm RAMAN, PA-C  Fluticasone -Umeclidin-Vilant (TRELEGY ELLIPTA ) 100-62.5-25 MCG/ACT AEPB Inhale 1 puff into the lungs daily. 07/27/24   Tysinger, Alm RAMAN, PA-C  Multiple Vitamin (MULTIVITAMIN) tablet Take 1 tablet by mouth daily.    [provider]  omeprazole  (PRILOSEC) 40 MG capsule TAKE 1 CAPSULE (40 MG TOTAL) BY MOUTH DAILY. 08/18/24   Tysinger, Alm RAMAN, PA-C  rosuvastatin  (CRESTOR ) 20 MG tablet Take 0.5 tablets (10 mg total) by mouth daily. 04/14/24   Tysinger, Alm RAMAN, PA-C  rosuvastatin  (CRESTOR ) 20 MG tablet Take 20 mg by mouth daily.    [provider]  tamsulosin  (FLOMAX ) 0.4 MG CAPS capsule TAKE 1 CAPSULE BY MOUTH EVERY DAY 01/19/24   Tysinger, Alm RAMAN, PA-C  tirzepatide  (MOUNJARO ) 12.5 MG/0.5ML Pen Inject 12.5 mg into the skin once a week. 07/27/24   Tysinger, Alm RAMAN, PA-C    Family History Family History   Problem Relation Age of Onset   Cancer Father    CAD Father    Colon cancer Neg Hx    Rectal cancer Neg Hx    Stomach cancer Neg Hx    Esophageal cancer Neg Hx     Social History Social History   Tobacco Use   Smoking status: Every Day    Current packs/day: 0.75    Types: Cigarettes   Smokeless tobacco: Current    Types: Chew  Vaping Use   Vaping status: Never Used  Substance Use Topics   Alcohol use: Not Currently    Alcohol/week: 0.0 standard drinks of alcohol   Drug use: Not Currently     Allergies   Patient has no known allergies.   Review of Systems Review of Systems  Constitutional:  Negative for chills and fever.  Genitourinary:  Negative for decreased urine volume, difficulty urinating, dysuria,  flank pain and frequency.  Musculoskeletal:  Positive for back pain.  Neurological:  Negative for numbness.     Physical Exam Triage Vital Signs ED Triage Vitals  Encounter Vitals Group     BP 09/16/24 1448 104/69     Girls Systolic BP Percentile --      Girls Diastolic BP Percentile --      Boys Systolic BP Percentile --      Boys Diastolic BP Percentile --      Pulse Rate 09/16/24 1448 82     Resp 09/16/24 1448 20     Temp 09/16/24 1448 97.7 F (36.5 C)     Temp Source 09/16/24 1448 Oral     SpO2 09/16/24 1448 96 %     Weight 09/16/24 1446 281 lb 15.5 oz (127.9 kg)     Height 09/16/24 1446 5' 10 (1.778 m)     Head Circumference --      Peak Flow --      Pain Score 09/16/24 1445 8     Pain Loc --      Pain Education --      Exclude from Growth Chart --    No data found.  Updated Vital Signs BP 104/69 (BP Location: Right Arm)   Pulse 82   Temp 97.7 F (36.5 C) (Oral)   Resp 20   Ht 5' 10 (1.778 m)   Wt 281 lb 15.5 oz (127.9 kg)   SpO2 96%   BMI 40.46 kg/m   Visual Acuity Right Eye Distance:   Left Eye Distance:   Bilateral Distance:    Right Eye Near:   Left Eye Near:    Bilateral Near:     Physical Exam Vitals reviewed.   Constitutional:      General: He is awake. He is not in acute distress.    Appearance: Normal appearance. He is well-developed and well-groomed. He is not ill-appearing, toxic-appearing or diaphoretic.  HENT:     Head: Normocephalic and atraumatic.  Eyes:     Extraocular Movements: Extraocular movements intact.     Conjunctiva/sclera: Conjunctivae normal.  Cardiovascular:     Rate and Rhythm: Normal rate and regular rhythm.     Heart sounds: Normal heart sounds. No murmur heard.    No friction rub. No gallop.  Pulmonary:     Effort: Pulmonary effort is normal.     Breath sounds: Normal breath sounds. No decreased air movement. No decreased breath sounds, wheezing, rhonchi or rales.  Abdominal:     Tenderness: There is no right CVA tenderness or left CVA tenderness.  Musculoskeletal:     Cervical back: Normal range of motion.     Comments: ROM findings Thoracic: Lateral flexion and lateral rotation are intact and symmetrical Lumbar: Extension, flexion are intact Hips: Extension, flexion, abduction, adduction are symmetrical and intact Pain on right side of spine with twisting and leaning back. Pain in both hip with sideways movement (flexion and abduction). Knees exhibit genu varum. Mild swelling on right side of lower back.  Neurological:     Mental Status: He is alert and oriented to person, place, and time.  Psychiatric:        Attention and Perception: Attention normal.        Mood and Affect: Mood normal.        Speech: Speech normal.        Behavior: Behavior normal. Behavior is cooperative.      UC Treatments / Results  Labs (all  labs ordered are listed, but only abnormal results are displayed) Labs Reviewed  POCT URINE DIPSTICK    EKG   Radiology No results found.  Procedures Procedures (including critical care time)  Medications Ordered in UC Medications  ketorolac  (TORADOL ) 30 MG/ML injection 30 mg (30 mg Intramuscular Given 09/16/24 1551)    Initial  Impression / Assessment and Plan / UC Course  I have reviewed the triage vital signs and the nursing notes.  Pertinent labs & imaging results that were available during my care of the patient were reviewed by me and considered in my medical decision making (see chart for details).      Final Clinical Impressions(s) / UC Diagnoses   Final diagnoses:  Acute right-sided low back pain with right-sided sciatica   Low back pain, right-sided predominant Acute right-sided low back pain for 3-4 days, possibly compensatory due to knee issues. Pain exacerbated by movements such as twisting and leaning back. Differential includes musculoskeletal strain or inflammation. No signs of kidney involvement. Previous use of Tylenol  and muscle relaxers provided no relief. - Administer Toradol  (ketorolac ) injection for pain and inflammation. - Prescribe meloxicam for daily use to manage inflammation and pain. - Provide stretching exercises to prevent stiffness. - Obtain urine sample to rule out urinary causes of pain.- Urine dip negative for hematuria or infection evidence.   Bilateral knee osteoarthritis Chronic bilateral knee pain with meniscus issues. Pain described as burning sensation, possibly due to nerve involvement or arthritis. Previous treatments include Icy Hot and infusions, discontinued due to lack of efficacy. Upcoming orthopedic appointment to discuss potential knee replacement. - Discuss knee pain and potential treatment options at upcoming orthopedic appointment.    Discharge Instructions      VISIT SUMMARY:  You came in today because of lower back pain that radiates to your right leg. You have been experiencing this pain for the past four days, and it gets worse when you stand up. You also mentioned ongoing knee problems and your well-controlled diabetes. Your urine was normal and did not show any evidence of UTI or blood in the urine   YOUR PLAN:  -LOW BACK PAIN, RIGHT-SIDED  PREDOMINANT: This is acute pain in your lower back that radiates to your right leg, likely due to musculoskeletal strain or inflammation. We gave you a Toradol  injection to help with the pain and inflammation. You will also start taking meloxicam daily to manage the pain and inflammation. Additionally, we provided you with stretching exercises to prevent stiffness. We will obtain a urine sample to rule out any urinary causes of the pain.  -BILATERAL KNEE OSTEOARTHRITIS: This is a chronic condition where the cartilage in your knee joints wears down, causing pain and stiffness. You have an upcoming orthopedic appointment to discuss potential treatment options, including knee replacement surgery.   INSTRUCTIONS:  Please follow up with your orthopedic appointment to discuss your knee pain and potential treatment options. Make sure to take the meloxicam as prescribed and perform the stretching exercises daily. We will contact you with the results of your urine sample.     ED Prescriptions     Medication Sig Dispense Auth. Provider   meloxicam (MOBIC) 15 MG tablet Take 1 tablet (15 mg total) by mouth daily. 30 tablet Zaid Tomes E, PA-C      PDMP not reviewed this encounter.   Marylene Rocky BRAVO, PA-C 09/16/24 1718

## 2024-09-16 NOTE — ED Notes (Signed)
 Water provided to patient to help obtain urine sample.

## 2024-09-16 NOTE — ED Triage Notes (Signed)
 Pt presents with a chief complaint of lower back pain x 3-4 days. Currently rates pain an 8/10. Describes the pain as shooting. Associated with light activity + exertion. Denies recent injuries to back. Unsure if it is his kidneys. Denies urinary symptoms. OTC Tylenol  taken today with no improvement/pain relief.

## 2024-09-23 ENCOUNTER — Other Ambulatory Visit (INDEPENDENT_AMBULATORY_CARE_PROVIDER_SITE_OTHER): Payer: Self-pay

## 2024-09-23 ENCOUNTER — Ambulatory Visit: Admitting: Surgical

## 2024-09-23 DIAGNOSIS — G8929 Other chronic pain: Secondary | ICD-10-CM

## 2024-09-23 DIAGNOSIS — M25552 Pain in left hip: Secondary | ICD-10-CM

## 2024-09-23 DIAGNOSIS — M545 Low back pain, unspecified: Secondary | ICD-10-CM | POA: Diagnosis not present

## 2024-09-23 DIAGNOSIS — R29898 Other symptoms and signs involving the musculoskeletal system: Secondary | ICD-10-CM

## 2024-09-23 DIAGNOSIS — M25511 Pain in right shoulder: Secondary | ICD-10-CM

## 2024-09-23 DIAGNOSIS — M1712 Unilateral primary osteoarthritis, left knee: Secondary | ICD-10-CM | POA: Diagnosis not present

## 2024-09-25 ENCOUNTER — Encounter: Payer: Self-pay | Admitting: Surgical

## 2024-09-25 MED ORDER — LIDOCAINE HCL 1 % IJ SOLN
10.0000 mL | INTRAMUSCULAR | Status: AC | PRN
  Administered 2024-09-23: 10 mL

## 2024-09-25 NOTE — Progress Notes (Signed)
 Office Visit Note   Patient: Jose [name]           Date of Birth: 09/29/1966           MRN: [id number] Visit Date: 09/23/2024 Requested by: Bulah Alm RAMAN, PA-C [address] [place],  [place] [address] PCP: Bulah Alm RAMAN, PA-C  Subjective: Chief Complaint  Patient presents with   Right Knee - Pain   Left Knee - Pain    HPI: Jose [name] is a 58 y.o. male who presents to the office reporting bilateral knee pain.  Patient states that he has a history of bilateral knee pain that has been progressively worsening.  He was previously seen about a year and a half ago with cortisone injection into the left knee at that time that according to him provided really no lasting relief.  He works in [employer] injury which involves a lot of walking and standing for long periods of time and this is becoming progressively more difficult.  He describes primarily medial sided pain.  Pain is waking him up from sleep pretty much every night.  He has associated rest pain with his medial knee pain that causes him discomfort even with just sitting at times.  He has about 10-minute walking endurance because of his knee pain.  He has tried Advil , Tylenol , several different topical medications as well as supplements without any lasting relief.  He has also lost 45 pounds without much relief.  He enjoys working as well as fishing.  In addition to the knee pain, he has very sparing groin pain and some burning and tingling in the left thigh.  The burning and tingling sensation is more of a recent symptom that he has noticed in the past 2.5 weeks.  He has some back pain and buttock pain as well but nothing substantially worse than his baseline.  He smokes 15 cigarettes/day on average.  Denies any history of uncontrolled diabetes but states that his PCP tells him that he is diabetic.  In addition to the leg pains, he states that he fell 8 months ago and since this fall, he has had right shoulder pain and  weakness to the point where he cannot reliably lift his right arm up and he has associated weakness when he tries to lift objects overhead.  He is primarily left-handed dominant.  When he has pain in the shoulder he primarily localizes it to the lateral aspect of the shoulder.  No history of prior shoulder surgery..                ROS: All systems reviewed are negative as they relate to the chief complaint within the history of present illness.  Patient denies fevers or chills.  Assessment & Plan: Visit Diagnoses:  1. Pain in left hip   2. Low back pain, unspecified back pain laterality, unspecified chronicity, unspecified whether sciatica present   3. 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  Left knee bothering him more than the right knee.  We discussed options available to patient.  At this point he has really failed conservative management with injections, medications, exercise not really providing any lasting relief of his symptoms.  He has gotten to the point where he has about 10-minute walking endurance.  He is interested in pursuing left knee replacement.  We discussed all options available to patient  including surgical and nonsurgical options.  Patient would like to proceed with left knee replacement.  Discussed risks and benefits of knee replacement including but not limited to the risk of nerve/blood vessel damage, knee stiffness, infection, need for revision surgery, loosening, continued pain/discomfort, medical complication from surgery such as DVT/PE/heart attack/stroke/death.  We discussed the recovery timeframe and the extensive rehab that is required after knee replacement surgery.  They understand that this is a very painful procedure and will require significant dedication in order to achieve optimal knee function after surgery.  Patient agreed with plan and would  like to proceed with knee replacement as soon as possible.  There was some concern today about left hip arthritis that could be masquerading as knee pain since this was discovered on pelvic x-ray obtained after noticing his significant lack of left hip motion.  In order to figure out which joint is causing more discomfort, 10 cc of lidocaine  was injected into the left knee and he had 90% improvement of his left knee pain with ambulation after that.  Seems that the left knee is primarily the cause of his knee pain rather than any significant contribution from his hip at this point.  However, patient mentioned right shoulder injury that he sustained about 8 months ago when he fell and has had persistent weakness of his right shoulder since this fall.  He has demonstrable weakness of his rotator cuff on exam today.  With the fact that he will need to be weightbearing through a walker and the fact that he may have a time sensitive rotator cuff tear that needs to be fixed before it retracts too far, need MRI arthrogram of the right shoulder for further evaluation of rotator cuff tear before proceeding with knee replacement as he may need rotator cuff repair prior to replacing his knee.  Patient and wife agreed with plan.  Follow-up after MRI to review results.   Follow-Up Instructions: No follow-ups on file.   Orders:  Orders Placed This Encounter  Procedures   XR HIP UNILAT W OR W/O PELVIS 2-3 VIEWS LEFT   XR Lumbar Spine 2-3 Views   XR Shoulder Right   MR SHOULDER RIGHT W CONTRAST   Arthrogram   No orders of the defined types were placed in this encounter.     Procedures: Large Joint Inj: L knee on 09/23/2024 11:19 AM Indications: diagnostic evaluation, joint swelling and pain Details: 25 G 1.5 in needle, superolateral approach  Arthrogram: No  Medications: 10 mL lidocaine  1 % Outcome: tolerated well, no immediate complications  Patient had 90% relief from diagnostic lidocaine  injection  indicating left knee is the main source of his left leg pain. Procedure, treatment alternatives, risks and benefits explained, specific risks discussed. Consent was given by the patient. Immediately prior to procedure a time out was called to verify the correct patient, procedure, equipment, support staff and site/side marked as required. Patient was prepped and draped in the usual sterile fashion.       Clinical Data: No additional findings.  Objective: Vital Signs: There were no vitals taken for this visit.  Physical Exam:  Constitutional: Patient appears well-developed HEENT:  Head: Normocephalic Eyes:EOM are normal Neck: Normal range of motion Cardiovascular: Normal rate Pulmonary/chest: Effort normal Neurologic: Patient is alert Skin: Skin is warm Psychiatric: Patient has normal mood and affect  Ortho Exam: Ortho ortho exam demonstrates left shoulder with 50 degrees X rotation, 120 degrees abduction, 170 degrees forward elevation which is compared with the  right shoulder with 45 degrees external rotation, 90 degrees abduction, 170 degrees forward elevation passively.  No obvious deformity to inspection of the shoulder.  Axillary nerve is intact with deltoid firing.  2+ radial pulse of the bilateral upper extremities.  Intact EPL, FPL, finger abduction, pronation/supination, bicep, tricep, deltoid.  Rotator cuff strength testing demonstrates excellent strength in the left shoulder with supra, infra, subscap strength demonstrating 5/5 motor strength; this is compared to the right shoulder demonstrating 5/5 subscap strength but 4/5 infraspinatus and supraspinatus strength..  Tender with palpation over bicipital groove but not the AC joint.  Negative crossarm adduction test.  Negative O'Brien sign.  Positive Neer's and Hawkins impingement signs.  No cellulitis or skin changes noted.  Mild crepitus noted with passive motion of the shoulder.  Negative external rotation lag sign.   Negative  Spurling sign.  Negative Lhermitte sign.  No pain reproduced with cervical spine range of motion.  Left knee with 10 degrees extension and 120 degrees of knee flexion.  Small effusion present in the left knee.  Right knee with 3 degrees extension and 120 degrees of knee flexion.  No effusion in the right knee.  No cellulitis or skin changes noted over either knee.  He has excellent quad and hamstring strength bilaterally.  He does have about 10 degrees of passive internal rotation of the right hip that is painless.  Has really no passive internal rotation of the left hip past 0 degrees with some reproduction of hip and knee pain with FADIR maneuver.  Hip flexion strength rated 5/5 without weakness.  Palpable pedal pulses of the bilateral lower extremities.  Specialty Comments:  No specialty comments available.  Imaging: No results found.   PMFS History: Patient Active Problem List   Diagnosis Date Noted   Smoker 07/27/2024   BMI 40.0-44.9, adult (HCC) 07/27/2024   Aortic atherosclerosis 12/22/2023   Benign prostatic hyperplasia with urinary frequency 12/22/2023   Urinary frequency 12/22/2023   Screening for lipid disorders 12/22/2023   Abnormal CT of the abdomen 12/22/2023   Fatigue 12/22/2023   Polyarthralgia 12/22/2023   Hepatic steatosis 12/22/2023   Type 2 diabetes mellitus with hyperglycemia, without long-term current use of insulin (HCC) 12/22/2023   Umbilical hernia without obstruction and without gangrene 01/18/2019   Wrist swelling, unspecified laterality 01/18/2019   Pain in both wrists 01/18/2019   Ganglion cyst of wrist, right 01/18/2019   Preop examination 01/18/2019   Obesity 01/18/2019   Low libido 01/18/2019   Chronic pain of both knees 01/18/2019   Carpal tunnel syndrome 09/26/2015   Past Medical History:  Diagnosis Date   Arthritis 2005   knees & wrists ? , meniscus worn - cortisone injection -   Carpal tunnel syndrome 09/26/2015   Bilateral   Chronic pain  of both knees 01/18/2019   Diabetes mellitus without complication (HCC)    Ganglion cyst of wrist, right 01/18/2019   Low libido    Obesity    Pain in both wrists 01/18/2019   Sickle cell trait    Smoker    Umbilical hernia    Umbilical hernia without obstruction and without gangrene 01/18/2019   Wrist pain    Wrist swelling, unspecified laterality 01/18/2019    Family History  Problem Relation Age of Onset   Cancer Father    CAD Father    Colon cancer Neg Hx    Rectal cancer Neg Hx    Stomach cancer Neg Hx    Esophageal cancer Neg Hx  Past Surgical History:  Procedure Laterality Date   calcium  deposit Left    Left arm- calcium  deposit removed    UMBILICAL HERNIA REPAIR N/A 02/04/2019   Procedure: UMBILICAL HERNIA REPAIR WITH MESH;  Surgeon: Vanderbilt Ned, MD;  Location: [facility] OR;  Service: General;  Laterality: N/A;   Social History   Occupational History   Not on file  Tobacco Use   Smoking status: Every Day    Current packs/day: 0.75    Types: Cigarettes   Smokeless tobacco: Current    Types: Chew  Vaping Use   Vaping status: Never Used  Substance and Sexual Activity   Alcohol use: Not Currently    Alcohol/week: 0.0 standard drinks of alcohol   Drug use: Not Currently   Sexual activity: Not on file

## 2024-09-27 ENCOUNTER — Ambulatory Visit: Admitting: Medical

## 2024-10-01 ENCOUNTER — Telehealth: Payer: Self-pay

## 2024-10-01 NOTE — Telephone Encounter (Signed)
 The patient's wife, Corean, called today to ask about a letter she received from the insurance company stating they were deciding on if the MRI was necessary. Corean wanted to know if they need to do anything, to make sure it's authorized prior to his MRI on 10/12/24. With looking up the referral, I do see where that prior auth request was pending on 09/24/24, but then was approved on 09/27/24. Advised that she most likely got a letter regarding the pending status from 09/24/24. Sometimes there is a delay from when we get the authorization, than when the patient receives the letter from [employer]. She voiced understanding and the patient will proceed with his MRI.

## 2024-10-11 ENCOUNTER — Ambulatory Visit (INDEPENDENT_AMBULATORY_CARE_PROVIDER_SITE_OTHER): Payer: Self-pay | Admitting: Medical

## 2024-10-11 ENCOUNTER — Encounter: Payer: Self-pay | Admitting: Radiology

## 2024-10-11 VITALS — BP 118/80 | HR 85 | Wt 280.6 lb

## 2024-10-11 DIAGNOSIS — E1165 Type 2 diabetes mellitus with hyperglycemia: Secondary | ICD-10-CM | POA: Diagnosis not present

## 2024-10-11 DIAGNOSIS — R350 Frequency of micturition: Secondary | ICD-10-CM

## 2024-10-11 DIAGNOSIS — I700 Atherosclerosis of aorta: Secondary | ICD-10-CM | POA: Diagnosis not present

## 2024-10-11 DIAGNOSIS — Z125 Encounter for screening for malignant neoplasm of prostate: Secondary | ICD-10-CM

## 2024-10-11 DIAGNOSIS — R5383 Other fatigue: Secondary | ICD-10-CM

## 2024-10-11 DIAGNOSIS — N401 Enlarged prostate with lower urinary tract symptoms: Secondary | ICD-10-CM | POA: Diagnosis not present

## 2024-10-11 DIAGNOSIS — Z2820 Immunization not carried out because of patient decision for unspecified reason: Secondary | ICD-10-CM

## 2024-10-11 DIAGNOSIS — Z7985 Long-term (current) use of injectable non-insulin antidiabetic drugs: Secondary | ICD-10-CM

## 2024-10-11 NOTE — Patient Instructions (Signed)
 Diabetes type 2 with hemoglobin A1c 6.9% earlier this year.  His hemoglobin A1c today is 5.3%.  He wants to continue at the current dose of Mounjaro  12.5 mg daily.  BPH-continue Flomax  0.4 mg daily  Aortic atherosclerosis, diabetes -Continue 10 mg Crestor  daily  Smoking-I recommend you quit smoking completely  Morbid obesity associated with diabetes - Counseled on diet working on lowering caloric intake to help lose additional weight  Chronic arthritis in knees-follow-up with orthopedics - Consider beginning supplements including glucosamine chondroitin and fish oil daily  Skin tags, skin lesion of chest, skin tags on the right side of the temple and orbit as well as skin tags circumferentially around the neck -Return at your convenience for biopsy and excision  Fatigue - We discussed differential which could include stress, sleep apnea, low testosterone, other.  Labs as below.  Consider sleep study  History of vitamin D deficiency-updated labs today

## 2024-10-11 NOTE — Progress Notes (Unsigned)
 Subjective:  Jose [name] is a 58 y.o. male who presents for Chief Complaint  Patient presents with   Follow-up    Follow-up on diabetes   Here for med check, chronic disease follow-up.  Accompanied by wife.  Hyperlipidemia-he is compliant with Crestor  20 mg daily  Diabetes-he is compliant with Mounjaro  12.5 mg weekly.  Checking glucose 3 times per week.   Seeing low 100s.  No low readings.  Was on trulicity  prior, but not losing weight.    BPH-he is compliant with Flomax  0.4 mg daily  He is a smoker. Used to smoke 1ppd.  Now <1/2 ppd.  Has smoked 12 years.  He has cut back on tobacco of late.  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  He notes fatigue, history of low vitamin D.  Not currently on supplemental vitamin D  His main issue is arthritis in his knees which keeps him up at night and he does not get good sleep because of the pain.  He sees orthopedics.  No other aggravating or relieving factors.    No other c/o.  Past Medical History:  Diagnosis Date   Arthritis 2005   knees & wrists ? , meniscus worn - cortisone injection -   Carpal tunnel syndrome 09/26/2015   Bilateral   Chronic pain of both knees 01/18/2019   Diabetes mellitus without complication (HCC)    Ganglion cyst of wrist, right 01/18/2019   Low libido    Obesity    Pain in both wrists 01/18/2019   Sickle cell trait    Smoker    Umbilical hernia    Umbilical hernia without obstruction and without gangrene 01/18/2019   Wrist pain    Wrist swelling, unspecified laterality 01/18/2019   Current Outpatient Medications on File Prior to Visit  Medication Sig Dispense Refill   Multiple Vitamin (MULTIVITAMIN) tablet Take 1 tablet by mouth daily.     rosuvastatin  (CRESTOR ) 20 MG tablet Take 0.5 tablets (10 mg total) by mouth daily.     tamsulosin  (FLOMAX ) 0.4 MG CAPS capsule TAKE 1 CAPSULE BY MOUTH EVERY DAY 90 capsule 2   tirzepatide  (MOUNJARO ) 12.5 MG/0.5ML Pen Inject 12.5 mg into the skin  once a week. 6 mL 0   No current facility-administered medications on file prior to visit.    Past Surgical History:  Procedure Laterality Date   calcium  deposit Left    Left arm- calcium  deposit removed    UMBILICAL HERNIA REPAIR N/A 02/04/2019   Procedure: UMBILICAL HERNIA REPAIR WITH MESH;  Surgeon: Vanderbilt Ned, MD;  Location: [facility] OR;  Service: General;  Laterality: N/A;    The following portions of the patient's history were reviewed and updated as appropriate: allergies, current medications, past family history, past medical history, past social history, past surgical history and problem list.  ROS Otherwise as in subjective above    Objective: BP 118/80   Pulse 85   Wt 280 lb 9.6 oz (127.3 kg)   SpO2 98%   BMI 40.26 kg/m   Wt Readings from Last 3 Encounters:  10/11/24 280 lb 9.6 oz (127.3 kg)  09/16/24 281 lb 15.5 oz (127.9 kg)  07/27/24 282 lb (127.9 kg)   BP Readings from Last 3 Encounters:  10/11/24 118/80  09/16/24 104/69  07/27/24 138/80   General appearance: alert, no distress, well developed, well nourished Neck: supple, no lymphadenopathy, no thyromegaly, no masses Heart: RRR, normal S1, S2, no murmurs Lungs: clear, no rhonchi, or rales Pulses:  2+ radial pulses, 2+ pedal pulses, normal cap refill Ext: no edema    Assessment: Encounter Diagnoses  Name Primary?   Type 2 diabetes mellitus with hyperglycemia, without long-term current use of insulin (HCC) Yes   Vaccine refused by patient    Benign prostatic hyperplasia with urinary frequency    Aortic atherosclerosis    Fatigue, unspecified type    Screening for prostate cancer    Morbid obesity (HCC)      Plan: Diabetes type 2 with hemoglobin A1c 6.9% earlier this year.  His hemoglobin A1c today is 5.3%.  He wants to continue at the current dose of Mounjaro  12.5 mg daily.  BPH-continue Flomax  0.4 mg daily  Aortic atherosclerosis, diabetes -Continue 10 mg Crestor  daily  Smoking-I  recommend you quit smoking completely  Morbid obesity associated with diabetes - Counseled on diet working on lowering caloric intake to help lose additional weight  Chronic arthritis in knees-follow-up with orthopedics - Consider beginning supplements including glucosamine chondroitin and fish oil daily  Skin tags, skin lesion of chest, skin tags on the right side of the temple and orbit as well as skin tags circumferentially around the neck -Return at your convenience for biopsy and excision  Fatigue - We discussed differential which could include stress, sleep apnea, low testosterone, other.  Labs as below.  Consider sleep study  History of vitamin D deficiency-updated labs today  Jose [name] was seen today for follow-up.  Diagnoses and all orders for this visit:  Type 2 diabetes mellitus with hyperglycemia, without long-term current use of insulin (HCC) -     POCT glycosylated hemoglobin (Hb A1C) -     Comprehensive metabolic panel with GFR  Vaccine refused by patient  Benign prostatic hyperplasia with urinary frequency  Aortic atherosclerosis  Fatigue, unspecified type -     Testosterone -     CBC -     VITAMIN D 25 Hydroxy (Vit-D Deficiency, Fractures)  Screening for prostate cancer -     PSA  Morbid obesity (HCC)   Follow up: 4 months

## 2024-10-12 ENCOUNTER — Ambulatory Visit: Payer: Self-pay | Admitting: Medical

## 2024-10-12 ENCOUNTER — Inpatient Hospital Stay: Admission: RE | Admit: 2024-10-12 | Source: Ambulatory Visit

## 2024-10-12 ENCOUNTER — Other Ambulatory Visit: Payer: Self-pay | Admitting: Medical

## 2024-10-12 ENCOUNTER — Other Ambulatory Visit

## 2024-10-12 LAB — COMPREHENSIVE METABOLIC PANEL WITH GFR
ALT: 40 IU/L (ref 0–44)
AST: 31 IU/L (ref 0–40)
Albumin: 4.3 g/dL (ref 3.8–4.9)
Alkaline Phosphatase: 70 IU/L (ref 47–123)
BUN/Creatinine Ratio: 10 (ref 10–24)
BUN: 9 mg/dL (ref 8–27)
Bilirubin Total: 0.6 mg/dL (ref 0.0–1.2)
CO2: 20 mmol/L (ref 20–29)
Calcium: 9.8 mg/dL (ref 8.6–10.2)
Chloride: 106 mmol/L (ref 96–106)
Creatinine, Ser: 0.93 mg/dL (ref 0.76–1.27)
Globulin, Total: 3.1 g/dL (ref 1.5–4.5)
Glucose: 106 mg/dL — ABNORMAL HIGH (ref 70–99)
Potassium: 4.5 mmol/L (ref 3.5–5.2)
Sodium: 140 mmol/L (ref 134–144)
Total Protein: 7.4 g/dL (ref 6.0–8.5)
eGFR: 94 mL/min/1.73 (ref >59)

## 2024-10-12 LAB — CBC
Hematocrit: 48.4 % (ref 37.5–51.0)
Hemoglobin: 15.8 g/dL (ref 13.0–17.7)
MCH: 32.6 pg (ref 26.6–33.0)
MCHC: 32.6 g/dL (ref 31.5–35.7)
MCV: 100 fL — ABNORMAL HIGH (ref 79–97)
Platelets: 221 x10E3/uL (ref 150–450)
RBC: 4.84 x10E6/uL (ref 4.14–5.80)
RDW: 10.8 % — ABNORMAL LOW (ref 11.6–15.4)
WBC: 5.9 x10E3/uL (ref 3.4–10.8)

## 2024-10-12 LAB — PSA: Prostate Specific Ag, Serum: 2.8 ng/mL (ref 0.0–4.0)

## 2024-10-12 LAB — TESTOSTERONE: Testosterone: 350 ng/dL (ref 264–916)

## 2024-10-12 LAB — VITAMIN D 25 HYDROXY (VIT D DEFICIENCY, FRACTURES): Vit D, 25-Hydroxy: 43.5 ng/mL (ref 30.0–100.0)

## 2024-10-12 MED ORDER — ROSUVASTATIN CALCIUM 10 MG PO TABS: 10.0000 mg | ORAL_TABLET | Freq: Every day | ORAL | Status: AC

## 2024-10-12 MED ORDER — TIRZEPATIDE 12.5 MG/0.5ML ~~LOC~~ SOAJ: 12.5000 mg | SUBCUTANEOUS | 3 refills | Status: DC

## 2024-10-12 MED ORDER — TAMSULOSIN HCL 0.4 MG PO CAPS: 0.4000 mg | ORAL_CAPSULE | Freq: Every day | ORAL | 3 refills | Status: DC

## 2024-10-12 NOTE — Progress Notes (Signed)
 Results through MyChart

## 2024-10-13 LAB — POCT GLYCOSYLATED HEMOGLOBIN (HGB A1C): Hemoglobin A1C: 5.3 % (ref 4.0–5.6)

## 2024-10-15 ENCOUNTER — Ambulatory Visit: Admitting: Surgical

## 2024-10-15 ENCOUNTER — Telehealth: Payer: Self-pay

## 2024-10-15 DIAGNOSIS — M1712 Unilateral primary osteoarthritis, left knee: Secondary | ICD-10-CM

## 2024-10-15 NOTE — Telephone Encounter (Signed)
 Copied from CRM #[id number]. Topic: Clinical - Prescription Issue >> Oct 15, 2024  2:15 PM Teressa P wrote: Reason for CRM: Pt's wife called saying the insurance is not paying for the mounjaro  like it used to .  It will be like 900.00 a month.  She wants to know if they can use a prescription.  CB#  [phone]

## 2024-10-18 NOTE — Telephone Encounter (Signed)
 Pt was on Trulicity  prior to switching to mounjaro . Her discount card will no longer pay for the mounjaro . She isn't not sure if insurance will pay for Trulicity , rybelsus or ozempic. She says she is good with whatever you decide to do   Pharmacy- [facility] [address]

## 2024-10-19 ENCOUNTER — Other Ambulatory Visit: Payer: Self-pay | Admitting: Medical

## 2024-10-19 MED ORDER — OZEMPIC (1 MG/DOSE) 4 MG/3ML ~~LOC~~ SOPN: 1.0000 mg | PEN_INJECTOR | SUBCUTANEOUS | 2 refills | Status: DC

## 2024-10-19 NOTE — Telephone Encounter (Signed)
  Sent back asking what is covered

## 2024-10-22 ENCOUNTER — Telehealth: Payer: Self-pay | Admitting: Pharmacy Technician

## 2024-10-22 ENCOUNTER — Other Ambulatory Visit (HOSPITAL_COMMUNITY): Payer: Self-pay

## 2024-10-22 NOTE — Telephone Encounter (Signed)
 Pharmacy Patient Advocate Encounter   Received notification from Onbase that prior authorization for Ozempic (1 MG/DOSE) 4MG /3ML pen-injectors is required/requested.   Insurance verification completed.   The patient is insured through [employer].   Per test claim: PA required; PA submitted to above mentioned insurance via Latent Key/confirmation #/EOC [id number] Status is pending

## 2024-10-25 ENCOUNTER — Other Ambulatory Visit (HOSPITAL_COMMUNITY): Payer: Self-pay

## 2024-10-25 NOTE — Telephone Encounter (Signed)
 Pharmacy Patient Advocate Encounter  Received notification from [employer] that Prior Authorization for Ozempic (1 MG/DOSE) 4MG /3ML pen-injectors has been APPROVED from 10/22/2024 to 10/22/2025. Ran test claim, Copay is $899.01. This test claim was processed through [facility]- copay amounts may vary at other pharmacies due to pharmacy/plan contracts, or as the patient moves through the different stages of their insurance plan.   PA #/Case ID/Reference #: [id number]   Patient can sign up for a savings card online at [email or web address] and click on savings and support and complete the steps to have the card sent to them via email. After receiving the card they would need to present it to their pharmacy to have them determine the price with the savings card.

## 2024-11-01 ENCOUNTER — Encounter: Payer: Self-pay | Admitting: Surgical

## 2024-11-01 ENCOUNTER — Ambulatory Visit: Admitting: Surgical

## 2024-11-01 NOTE — Progress Notes (Deleted)
 Office Visit Note   Patient: Jose [name]           Date of Birth: Dec 04, 1966           MRN: [id number] Visit Date: 10/15/2024 Requested by: Bulah Alm RAMAN, PA-C [address] [place],  [place] [address] PCP: Bulah Alm RAMAN, PA-C  Subjective: Chief Complaint  Patient presents with  . Left Knee - Pain    HPI: Jose [name] is a 58 y.o. male who presents to the office reporting ***.                ROS: All systems reviewed are negative as they relate to the chief complaint within the history of present illness.  Patient denies fevers or chills.  Assessment & Plan: Visit Diagnoses: No diagnosis found.  Plan: ***  Follow-Up Instructions: No follow-ups on file.   Orders:  No orders of the defined types were placed in this encounter.  No orders of the defined types were placed in this encounter.     Procedures: No procedures performed   Clinical Data: No additional findings.  Objective: Vital Signs: There were no vitals taken for this visit.  Physical Exam:  Constitutional: Patient appears well-developed HEENT:  Head: Normocephalic Eyes:EOM are normal Neck: Normal range of motion Cardiovascular: Normal rate Pulmonary/chest: Effort normal Neurologic: Patient is alert Skin: Skin is warm Psychiatric: Patient has normal mood and affect  Ortho Exam: ***  Specialty Comments:  No specialty comments available.  Imaging: No results found.   PMFS History: Patient Active Problem List   Diagnosis Date Noted  . Morbid obesity (HCC) 10/11/2024  . Smoker 07/27/2024  . BMI 40.0-44.9, adult (HCC) 07/27/2024  . Aortic atherosclerosis 12/22/2023  . Benign prostatic hyperplasia with urinary frequency 12/22/2023  . Urinary frequency 12/22/2023  . Screening for lipid disorders 12/22/2023  . Abnormal CT of the abdomen 12/22/2023  . Fatigue 12/22/2023  . Polyarthralgia 12/22/2023  . Hepatic steatosis 12/22/2023  . Type 2 diabetes mellitus with hyperglycemia,  without long-term current use of insulin (HCC) 12/22/2023  . Umbilical hernia without obstruction and without gangrene 01/18/2019  . Wrist swelling, unspecified laterality 01/18/2019  . Pain in both wrists 01/18/2019  . Ganglion cyst of wrist, right 01/18/2019  . Preop examination 01/18/2019  . Obesity 01/18/2019  . Low libido 01/18/2019  . Chronic pain of both knees 01/18/2019  . Carpal tunnel syndrome 09/26/2015   Past Medical History:  Diagnosis Date  . Arthritis 2005   knees & wrists ? , meniscus worn - cortisone injection -  . Carpal tunnel syndrome 09/26/2015   Bilateral  . Chronic pain of both knees 01/18/2019  . Diabetes mellitus without complication (HCC)   . Ganglion cyst of wrist, right 01/18/2019  . Low libido   . Obesity   . Pain in both wrists 01/18/2019  . Sickle cell trait   . Smoker   . Umbilical hernia   . Umbilical hernia without obstruction and without gangrene 01/18/2019  . Wrist pain   . Wrist swelling, unspecified laterality 01/18/2019    Family History  Problem Relation Age of Onset  . Cancer Father   . CAD Father   . Colon cancer Neg Hx   . Rectal cancer Neg Hx   . Stomach cancer Neg Hx   . Esophageal cancer Neg Hx     Past Surgical History:  Procedure Laterality Date  . calcium  deposit Left    Left arm- calcium  deposit removed   .  UMBILICAL HERNIA REPAIR N/A 02/04/2019   Procedure: UMBILICAL HERNIA REPAIR WITH MESH;  Surgeon: Vanderbilt Ned, MD;  Location: [facility] OR;  Service: General;  Laterality: N/A;   Social History   Occupational History  . Not on file  Tobacco Use  . Smoking status: Every Day    Current packs/day: 0.75    Types: Cigarettes  . Smokeless tobacco: Current    Types: Chew  Vaping Use  . Vaping status: Never Used  Substance and Sexual Activity  . Alcohol use: Not Currently    Alcohol/week: 0.0 standard drinks of alcohol  . Drug use: Not Currently  . Sexual activity: Not on file

## 2024-11-10 ENCOUNTER — Telehealth: Payer: Self-pay

## 2024-11-10 NOTE — Telephone Encounter (Signed)
 [name] can you figure out what is happening with Jose [name].  Luke filled out a surgery sheet on him about 3 weeks ago.  His last note from the seventh is not completed.  [name] can you complete that note on [name] from 11 7.  Thanks

## 2024-11-10 NOTE — Telephone Encounter (Signed)
 Seen 11/7 Surgery sheet filled out on 11/20-clearance needed

## 2024-11-28 ENCOUNTER — Encounter: Payer: Self-pay | Admitting: Surgical

## 2024-11-28 NOTE — Progress Notes (Signed)
 "  Follow-up Office Visit Note   Patient: Jose [name]           Date of Birth: [birth year]           MRN: [id number] Visit Date: 10/15/2024 Requested by: Bulah Alm RAMAN, PA-C [address] [place],  [place] [address] PCP: Bulah Alm RAMAN, PA-C  Subjective: Chief Complaint  Patient presents with   Left Knee - Pain    HPI: Jose [name] is a 58 y.o. male who returns to the office for follow-up visit.    Plan at last visit was:  Impression is 58 year old male who has history of moderate to severe primarily medial compartment arthritis in both knees.  Left knee bothering him more than the right knee.  We discussed options available to patient.  At this point he has really failed conservative management with injections, medications, exercise not really providing any lasting relief of his symptoms.  He has gotten to the point where he has about 10-minute walking endurance.  He is interested in pursuing left knee replacement.   We discussed all options available to patient including surgical and nonsurgical options.  Patient would like to proceed with left knee replacement.  Discussed risks and benefits of knee replacement including but not limited to the risk of nerve/blood vessel damage, knee stiffness, infection, need for revision surgery, loosening, continued pain/discomfort, medical complication from surgery such as DVT/PE/heart attack/stroke/death.  We discussed the recovery timeframe and the extensive rehab that is required after knee replacement surgery.  They understand that this is a very painful procedure and will require significant dedication in order to achieve optimal knee function after surgery.  Patient agreed with plan and would like to proceed with knee replacement as soon as possible.   There was some concern today about left hip arthritis that could be masquerading as knee pain since this was discovered on pelvic x-ray obtained after noticing his significant lack of left hip  motion.  In order to figure out which joint is causing more discomfort, 10 cc of lidocaine  was injected into the left knee and he had 90% improvement of his left knee pain with ambulation after that.  Seems that the left knee is primarily the cause of his knee pain rather than any significant contribution from his hip at this point.   However, patient mentioned right shoulder injury that he sustained about 8 months ago when he fell and has had persistent weakness of his right shoulder since this fall.  He has demonstrable weakness of his rotator cuff on exam today.  With the fact that he will need to be weightbearing through a walker and the fact that he may have a time sensitive rotator cuff tear that needs to be fixed before it retracts too far, need MRI arthrogram of the right shoulder for further evaluation of rotator cuff tear before proceeding with knee replacement.  Patient and wife agreed with plan.  Follow-up after MRI to review results.  Since then, patient notes he would like to have knee replacement as soon as possible.  He is not interested in addressing his shoulder as he may have a little bit of weakness but he is not having any significant discomfort or alteration to the way he is living his life from his shoulder.              ROS: All systems reviewed are negative as they relate to the chief complaint within the history of present illness.  Patient  denies fevers or chills.  Assessment & Plan: Visit Diagnoses:  1. Unilateral primary osteoarthritis, left knee     Plan: Jose [name] is a 58 y.o. male who returns to the office for follow-up visit.  Plan from last visit was noted above in HPI.  They now return with continued severe knee pain.  Left knee is bothering him enough that he would like to proceed with knee replacement as soon as possible.  We discussed all options available to patient including surgical and nonsurgical options.  Patient would like to proceed with knee  replacement.  We had lengthy discussion and discussed risks and benefits of knee replacement including but not limited to the risk of nerve/blood vessel damage, knee stiffness, infection, need for revision surgery, loosening, continued pain/discomfort, medical complication from surgery such as DVT/PE/heart attack/stroke/death.  We discussed the recovery timeframe and the extensive rehab that is required after knee replacement surgery.  They understand that this is a very painful procedure and will require significant dedication in order to achieve optimal knee function after surgery.  Patient agreed with plan.  Follow-up after procedure.  We once again strongly encouraged him to discontinue smoking at least for the time being to optimize healing around time of surgery.  We can reevaluate his shoulder after surgery and if he continues to have weakness or starts to notice more discomfort, next up would be MRI arthrogram of the right shoulder.  Follow-Up Instructions: Return for After procedure.   Orders:  No orders of the defined types were placed in this encounter.  No orders of the defined types were placed in this encounter.     Procedures: No procedures performed   Clinical Data: No additional findings.  Objective: Vital Signs: There were no vitals taken for this visit.  Physical Exam:  Constitutional: Patient appears well-developed HEENT:  Head: Normocephalic Eyes:EOM are normal Neck: Normal range of motion Cardiovascular: Normal rate Pulmonary/chest: Effort normal Neurologic: Patient is alert Skin: Skin is warm Psychiatric: Patient has normal mood and affect  Ortho Exam: Ortho exam demonstrates left knee with 10 degrees extension 120 degrees of knee flexion.  Small effusion in the left knee.  No cellulitis or skin changes.  5/5 quad and hamstring strength.  Palpable DP pulse of the left lower extremity rated 2+.  Stable to anterior posterior drawer sign.  Stable to varus and  valgus stress.  Intact ankle dorsiflexion plantarflexion.  Specialty Comments:  No specialty comments available.  Imaging: No results found.   PMFS History: Patient Active Problem List   Diagnosis Date Noted   Morbid obesity (HCC) 10/11/2024   Smoker 07/27/2024   BMI 40.0-44.9, adult (HCC) 07/27/2024   Aortic atherosclerosis 12/22/2023   Benign prostatic hyperplasia with urinary frequency 12/22/2023   Urinary frequency 12/22/2023   Screening for lipid disorders 12/22/2023   Abnormal CT of the abdomen 12/22/2023   Fatigue 12/22/2023   Polyarthralgia 12/22/2023   Hepatic steatosis 12/22/2023   Type 2 diabetes mellitus with hyperglycemia, without long-term current use of insulin (HCC) 12/22/2023   Umbilical hernia without obstruction and without gangrene 01/18/2019   Wrist swelling, unspecified laterality 01/18/2019   Pain in both wrists 01/18/2019   Ganglion cyst of wrist, right 01/18/2019   Preop examination 01/18/2019   Obesity 01/18/2019   Low libido 01/18/2019   Chronic pain of both knees 01/18/2019   Carpal tunnel syndrome 09/26/2015   Past Medical History:  Diagnosis Date   Arthritis 2005   knees & wrists ? ,  meniscus worn - cortisone injection -   Carpal tunnel syndrome 09/26/2015   Bilateral   Chronic pain of both knees 01/18/2019   Diabetes mellitus without complication (HCC)    Ganglion cyst of wrist, right 01/18/2019   Low libido    Obesity    Pain in both wrists 01/18/2019   Sickle cell trait    Smoker    Umbilical hernia    Umbilical hernia without obstruction and without gangrene 01/18/2019   Wrist pain    Wrist swelling, unspecified laterality 01/18/2019    Family History  Problem Relation Age of Onset   Cancer Father    CAD Father    Colon cancer Neg Hx    Rectal cancer Neg Hx    Stomach cancer Neg Hx    Esophageal cancer Neg Hx     Past Surgical History:  Procedure Laterality Date   calcium  deposit Left    Left arm- calcium  deposit  removed    UMBILICAL HERNIA REPAIR N/A 02/04/2019   Procedure: UMBILICAL HERNIA REPAIR WITH MESH;  Surgeon: Vanderbilt Ned, MD;  Location: [facility] OR;  Service: General;  Laterality: N/A;   Social History   Occupational History   Not on file  Tobacco Use   Smoking status: Every Day    Current packs/day: 0.75    Types: Cigarettes   Smokeless tobacco: Current    Types: Chew  Vaping Use   Vaping status: Never Used  Substance and Sexual Activity   Alcohol use: Not Currently    Alcohol/week: 0.0 standard drinks of alcohol   Drug use: Not Currently   Sexual activity: Not on file         "

## 2024-12-15 ENCOUNTER — Ambulatory Visit (INDEPENDENT_AMBULATORY_CARE_PROVIDER_SITE_OTHER)

## 2024-12-15 ENCOUNTER — Telehealth: Payer: Self-pay | Admitting: Emergency Medicine

## 2024-12-15 ENCOUNTER — Ambulatory Visit
Admission: EM | Admit: 2024-12-15 | Discharge: 2024-12-15 | Disposition: A | Discharge status: Home / Self Care | Attending: Student | Admitting: Student

## 2024-12-15 ENCOUNTER — Encounter: Payer: Self-pay | Admitting: Emergency Medicine

## 2024-12-15 DIAGNOSIS — R059 Cough, unspecified: Secondary | ICD-10-CM

## 2024-12-15 LAB — POCT INFLUENZA A/B
Influenza A, POC: NEGATIVE
Influenza B, POC: NEGATIVE

## 2024-12-15 MED ORDER — ALBUTEROL SULFATE HFA 108 (90 BASE) MCG/ACT IN AERS: 1.0000 | INHALATION_SPRAY | Freq: Four times a day (QID) | RESPIRATORY_TRACT | 0 refills | Status: AC | PRN

## 2024-12-15 MED ORDER — PREDNISONE 20 MG PO TABS: 40.0000 mg | ORAL_TABLET | Freq: Every day | ORAL | 0 refills | Status: AC

## 2024-12-15 MED ORDER — IPRATROPIUM-ALBUTEROL 0.5-2.5 (3) MG/3ML IN SOLN
3.0000 mL | Freq: Once | RESPIRATORY_TRACT | Status: AC
  Administered 2024-12-15: 3 mL via RESPIRATORY_TRACT

## 2024-12-15 NOTE — Telephone Encounter (Signed)
 Updated patient on x-ray results. Informed that x-ray was normal and to continue prednisone  and inhaler as prescribed and to follow up if symptoms get worse or persist.

## 2024-12-15 NOTE — Discharge Instructions (Addendum)
-  Your flu test was negative. You probably have another virus, like the common cold. -We are treating your shortness of breath with medications to help open up your lungs: -Prednisone , 2 pills taken at the same time for 5 days in a row.  Try taking this earlier in the day as it can give you energy. Avoid NSAIDs like ibuprofen  and alleve while taking this medication as they can increase your risk of stomach upset and even GI bleeding when in combination with a steroid. You can continue tylenol  (acetaminophen ) up to 1000mg  3x daily. -Albuterol  inhaler as needed for cough, wheezing, shortness of breath, 1 to 2 puffs every 6 hours as needed. -We also need to do an xray to check if you have pneumonia. I will call within about 90 minutes of the xray being done, and we can send additional treatment if needed.  -Your cough should slowly get better instead of worse. If you develop a cough productive of dark or red sputum, new shortness of breath, new chest tightness, new fevers, etc - seek additional care.

## 2024-12-15 NOTE — ED Provider Notes (Signed)
 [facility]    CSN: [id number] Arrival date & time: 12/15/24  0808      History   Chief Complaint Chief Complaint  Patient presents with   Shortness of Breath   Cough    HPI Jose [name] is a 59 y.o. male presenting w viral syndrome.  Pt c/o sob, chills, cough, rib pain with coughing (not at rest). Cough is nonproductive. Denies myalgias. Symptoms began Monday 1/5. Has attempted Nyquil night/day, mucinex .    The patient denies a history of pulmonary disease. Current every day smoker.    HPI  Past Medical History:  Diagnosis Date   Arthritis 2005   knees & wrists ? , meniscus worn - cortisone injection -   Carpal tunnel syndrome 09/26/2015   Bilateral   Chronic pain of both knees 01/18/2019   Diabetes mellitus without complication (HCC)    Ganglion cyst of wrist, right 01/18/2019   Low libido    Obesity    Pain in both wrists 01/18/2019   Sickle cell trait    Smoker    Umbilical hernia    Umbilical hernia without obstruction and without gangrene 01/18/2019   Wrist pain    Wrist swelling, unspecified laterality 01/18/2019    Patient Active Problem List   Diagnosis Date Noted   Morbid obesity (HCC) 10/11/2024   Smoker 07/27/2024   BMI 40.0-44.9, adult (HCC) 07/27/2024   Aortic atherosclerosis 12/22/2023   Benign prostatic hyperplasia with urinary frequency 12/22/2023   Urinary frequency 12/22/2023   Screening for lipid disorders 12/22/2023   Abnormal CT of the abdomen 12/22/2023   Fatigue 12/22/2023   Polyarthralgia 12/22/2023   Hepatic steatosis 12/22/2023   Type 2 diabetes mellitus with hyperglycemia, without long-term current use of insulin (HCC) 12/22/2023   Umbilical hernia without obstruction and without gangrene 01/18/2019   Wrist swelling, unspecified laterality 01/18/2019   Pain in both wrists 01/18/2019   Ganglion cyst of wrist, right 01/18/2019   Preop examination 01/18/2019   Obesity 01/18/2019   Low libido 01/18/2019   Chronic  pain of both knees 01/18/2019   Carpal tunnel syndrome 09/26/2015    Past Surgical History:  Procedure Laterality Date   calcium  deposit Left    Left arm- calcium  deposit removed    UMBILICAL HERNIA REPAIR N/A 02/04/2019   Procedure: UMBILICAL HERNIA REPAIR WITH MESH;  Surgeon: Vanderbilt Ned, MD;  Location: [facility] OR;  Service: General;  Laterality: N/A;       Home Medications    Prior to Admission medications  Medication Sig Start Date End Date Taking? Authorizing Provider  albuterol  (VENTOLIN  HFA) 108 (90 Base) MCG/ACT inhaler Inhale 1-2 puffs into the lungs every 6 (six) hours as needed for wheezing or shortness of breath. 12/15/24  Yes Ixel Boehning E, PA-C  predniSONE  (DELTASONE ) 20 MG tablet Take 2 tablets (40 mg total) by mouth daily for 5 days. 12/15/24 12/20/24 Yes Desira Alessandrini E, PA-C  Multiple Vitamin (MULTIVITAMIN) tablet Take 1 tablet by mouth daily.    [provider]  rosuvastatin  (CRESTOR ) 10 MG tablet Take 1 tablet (10 mg total) by mouth daily. 10/12/24 10/12/25  Tysinger, Alm RAMAN, PA-C  Semaglutide , 1 MG/DOSE, (OZEMPIC , 1 MG/DOSE,) 4 MG/3ML SOPN Inject 1 mg into the skin once a week. 10/19/24   Tysinger, Alm RAMAN, PA-C    Family History Family History  Problem Relation Age of Onset   Cancer Father    CAD Father    Colon cancer Neg Hx  Rectal cancer Neg Hx    Stomach cancer Neg Hx    Esophageal cancer Neg Hx     Social History Social History[1]   Allergies   Patient has no known allergies.   Review of Systems Review of Systems  Constitutional:  Positive for fatigue. Negative for appetite change, chills and fever.  HENT:  Positive for congestion. Negative for ear pain, rhinorrhea, sinus pressure, sinus pain and sore throat.   Eyes:  Negative for redness and visual disturbance.  Respiratory:  Positive for cough. Negative for chest tightness, shortness of breath and wheezing.   Cardiovascular:  Negative for chest pain and palpitations.   Gastrointestinal:  Negative for abdominal pain, constipation, diarrhea, nausea and vomiting.  Genitourinary:  Negative for dysuria, frequency and urgency.  Musculoskeletal:  Negative for myalgias.  Neurological:  Negative for dizziness, weakness and headaches.  Psychiatric/Behavioral:  Negative for confusion.   All other systems reviewed and are negative.    Physical Exam Triage Vital Signs ED Triage Vitals  Encounter Vitals Group     BP      Girls Systolic BP Percentile      Girls Diastolic BP Percentile      Boys Systolic BP Percentile      Boys Diastolic BP Percentile      Pulse      Resp      Temp      Temp src      SpO2      Weight      Height      Head Circumference      Peak Flow      Pain Score      Pain Loc      Pain Education      Exclude from Growth Chart    No data found.  Updated Vital Signs BP 134/85 (BP Location: Right Arm)   Pulse 96   Temp 98.8 F (37.1 C) (Oral)   Resp 18   SpO2 92%   Visual Acuity Right Eye Distance:   Left Eye Distance:   Bilateral Distance:    Right Eye Near:   Left Eye Near:    Bilateral Near:     Physical Exam Vitals reviewed.  Constitutional:      General: He is not in acute distress.    Appearance: Normal appearance. He is not ill-appearing.  HENT:     Head: Normocephalic and atraumatic.     Right Ear: Tympanic membrane, ear canal and external ear normal. No tenderness. No middle ear effusion. There is no impacted cerumen. Tympanic membrane is not perforated, erythematous, retracted or bulging.     Left Ear: Tympanic membrane, ear canal and external ear normal. No tenderness.  No middle ear effusion. There is no impacted cerumen. Tympanic membrane is not perforated, erythematous, retracted or bulging.     Nose: Nose normal. No congestion.     Mouth/Throat:     Mouth: Mucous membranes are moist.     Pharynx: Uvula midline. No oropharyngeal exudate or posterior oropharyngeal erythema.     Tonsils: No tonsillar  exudate.  Eyes:     Extraocular Movements: Extraocular movements intact.     Pupils: Pupils are equal, round, and reactive to light.  Cardiovascular:     Rate and Rhythm: Normal rate and regular rhythm.     Heart sounds: Normal heart sounds.  Pulmonary:     Effort: Pulmonary effort is normal.     Breath sounds: Normal breath sounds. No decreased breath  sounds, wheezing, rhonchi or rales.  Abdominal:     Palpations: Abdomen is soft.     Tenderness: There is no abdominal tenderness. There is no guarding or rebound.  Musculoskeletal:     Comments: No pedal edema  Lymphadenopathy:     Cervical: No cervical adenopathy.     Right cervical: No superficial, deep or posterior cervical adenopathy.    Left cervical: No superficial, deep or posterior cervical adenopathy.  Skin:    Comments: No rash   Neurological:     General: No focal deficit present.     Mental Status: He is alert and oriented to person, place, and time.  Psychiatric:        Mood and Affect: Mood normal.        Behavior: Behavior normal.        Thought Content: Thought content normal.        Judgment: Judgment normal.      UC Treatments / Results  Labs (all labs ordered are listed, but only abnormal results are displayed) Labs Reviewed  POCT INFLUENZA A/B - Normal    EKG   Radiology DG Chest 2 View Result Date: 12/15/2024 CLINICAL DATA:  Shortness of breath. EXAM: CHEST - 2 VIEW COMPARISON:  03/16/2016 FINDINGS: Lungs are adequately inflated without airspace consolidation or effusion. Cardiomediastinal silhouette and remainder of the exam is unchanged. IMPRESSION: No active cardiopulmonary disease. Electronically Signed   By: Toribio Agreste M.D.   On: 12/15/2024 10:01    Procedures Procedures (including critical care time)  Medications Ordered in UC Medications  ipratropium-albuterol  (DUONEB) 0.5-2.5 (3) MG/3ML nebulizer solution 3 mL (3 mLs Nebulization Given 12/15/24 0835)    Initial Impression /  Assessment and Plan / UC Course  I have reviewed the triage vital signs and the nursing notes.  Pertinent labs & imaging results that were available during my care of the patient were reviewed by me and considered in my medical decision making (see chart for details).     Patient is a pleasant 59 y.o. male presenting with cough and shortness of breath. The patient is afebrile and nontachycardic.  Antipyretic has not been administered today.  On exam, lungs are clear to auscultation.  On exam, there is no pedal edema, and calves are equal and symmetric.  He does not have a prior history of DVT or PE.  No recent prolonged immobilization or surgery.  Does not take HRT.  Wells score for PE is 0.  Influenza negative  On exam, lungs are clear to auscultation. Following DuoNeb treatment, minimal improvement in the patient's shortness of breath, and no change in breath sounds.  CXR: No active cardiopulmonary disease.  Diabetes is well-controlled, with sugars running 110 at home.  Prednisone  burst sent.  Albuterol  inhaler sent for as needed use.  Return precautions as below.  Final Clinical Impressions(s) / UC Diagnoses   Final diagnoses:  Cough, unspecified type     Discharge Instructions      -Your flu test was negative. You probably have another virus, like the common cold. -We are treating your shortness of breath with medications to help open up your lungs: -Prednisone , 2 pills taken at the same time for 5 days in a row.  Try taking this earlier in the day as it can give you energy. Avoid NSAIDs like ibuprofen  and alleve while taking this medication as they can increase your risk of stomach upset and even GI bleeding when in combination with a steroid. You can continue  tylenol  (acetaminophen ) up to 1000mg  3x daily. -Albuterol  inhaler as needed for cough, wheezing, shortness of breath, 1 to 2 puffs every 6 hours as needed. -We also need to do an xray to check if you have pneumonia. I will  call within about 90 minutes of the xray being done, and we can send additional treatment if needed.  -Your cough should slowly get better instead of worse. If you develop a cough productive of dark or red sputum, new shortness of breath, new chest tightness, new fevers, etc - seek additional care.     ED Prescriptions     Medication Sig Dispense Auth. Provider   predniSONE  (DELTASONE ) 20 MG tablet Take 2 tablets (40 mg total) by mouth daily for 5 days. 10 tablet Marithza Malachi E, PA-C   albuterol  (VENTOLIN  HFA) 108 (90 Base) MCG/ACT inhaler Inhale 1-2 puffs into the lungs every 6 (six) hours as needed for wheezing or shortness of breath. 1 each Arlyss Leita BRAVO, PA-C      PDMP not reviewed this encounter.     [1]  Social History Tobacco Use   Smoking status: Every Day    Current packs/day: 0.75    Types: Cigarettes   Smokeless tobacco: Current    Types: Chew  Vaping Use   Vaping status: Never Used  Substance Use Topics   Alcohol use: Not Currently    Alcohol/week: 0.0 standard drinks of alcohol   Drug use: Not Currently     Arlyss Leita BRAVO DEVONNA 12/15/24 1010  "

## 2024-12-15 NOTE — ED Triage Notes (Signed)
 Pt c/o sob, chills, cough, rib pain with coughing. Symptoms began Monday.

## 2024-12-23 ENCOUNTER — Telehealth (HOSPITAL_BASED_OUTPATIENT_CLINIC_OR_DEPARTMENT_OTHER): Payer: Self-pay

## 2024-12-23 ENCOUNTER — Telehealth: Payer: Self-pay | Admitting: Medical

## 2024-12-23 NOTE — Telephone Encounter (Signed)
" ° °  Name: Jose [name]  DOB: 1966/01/05  MRN: [id number]  Primary Cardiologist: Aleene Passe, MD (Inactive)  Chart reviewed as part of pre-operative protocol coverage. Because of Jose [name]'s past medical history and time since last visit, he will require a follow-up in-office visit in order to better assess preoperative cardiovascular risk.  Pre-op covering staff: - Please schedule appointment and call patient to inform them. If patient already had an upcoming appointment within acceptable timeframe, please add pre-op clearance to the appointment notes so provider is aware. - Please contact requesting surgeon's office via preferred method (i.e, phone, fax) to inform them of need for appointment prior to surgery.   Lum LITTIE Louis, NP  12/23/2024, 11:44 AM   "

## 2024-12-23 NOTE — Telephone Encounter (Signed)
 Wife Charleston) stated patient will now not be having knee surgery and wants to cancel this clearance request.

## 2024-12-23 NOTE — Telephone Encounter (Signed)
 Per orthopedic request for surgery, please schedule preop visit which needs to include EKG

## 2024-12-23 NOTE — Telephone Encounter (Signed)
 Per patient's spouse patient is not getting surgery done therefore will remove patient clearance from preop pool

## 2024-12-23 NOTE — Telephone Encounter (Signed)
 Called patient to schedule IN OFFICE visit spoke to wife she is going to call back whenever she is with the patient

## 2024-12-23 NOTE — Telephone Encounter (Signed)
"  ° °  Pre-operative Risk Assessment    Patient Name: Jose [name]  DOB: May 05, 1966 MRN: [id number]   Date of last office visit: 02/02/19 with Dr. Alveta Date of next office visit: NA   Request for Surgical Clearance    Procedure:  Left total knee arthroplasty   Date of Surgery:  Clearance TBD                                 Surgeon:  Dr. Addie  Surgeon's Group or Practice Name:  [facility] Phone number:  [phone] Fax number:  [phone]   Type of Clearance Requested:   - Medical    Type of Anesthesia:  Spinal   Additional requests/questions:    SignedAugustin JONETTA Daring   12/23/2024, 9:21 AM   "

## 2024-12-24 ENCOUNTER — Other Ambulatory Visit (HOSPITAL_COMMUNITY): Payer: Self-pay

## 2024-12-24 ENCOUNTER — Telehealth: Payer: Self-pay | Admitting: Orthopedic Surgery

## 2024-12-24 NOTE — Telephone Encounter (Signed)
 Okay thanks very much [employer]

## 2024-12-24 NOTE — Telephone Encounter (Signed)
 Called patient to discuss surgery date for left total knee arthroplasty.  Spoke with patient's wife Corean.  She said that her husband would like to hold off on scheduling surgery at this time.  He will call our office and ask for Dr. Brion surgery scheduler if he changes his mind.  Patient is working toward weight loss. He is taking ozempic  injections. [employer] insurance coverage is effective 12-09-24 and in some cases sets a threshold for BMI at 40 or below. Patient is a smoker, has aortic sclerosis, and is diabetic.  A clearance request was faxed yesterday to Dr Bulah and [facility].

## 2024-12-27 NOTE — Telephone Encounter (Signed)
 I will send notes to requesting office to see notes from 12/23/24 per pt's wife to CMA the pt is not having surgery.

## 2024-12-28 ENCOUNTER — Telehealth: Payer: Self-pay | Admitting: Pharmacy Technician

## 2024-12-28 NOTE — Telephone Encounter (Signed)
 Pharmacy Patient Advocate Encounter   Received notification from [employer] [facility] that prior authorization for Mounjaro  12.5mg /0.60ml auto-injectors is required/requested.   Insurance verification completed.   The patient is insured through [facility] [employer].   Patient does not have an active prescription for Mounjaro  on their profile. Chart reflects that Mounjaro  was changed to Ozempic  back in November. Patient filled the Mounjaro  last on 11/23/2024. Can you advise on current patient therapy?  CMM Key# [id number]

## 2024-12-29 ENCOUNTER — Other Ambulatory Visit: Payer: Self-pay | Admitting: Medical

## 2024-12-29 MED ORDER — MOUNJARO 12.5 MG/0.5ML ~~LOC~~ SOAJ: 12.5000 mg | SUBCUTANEOUS | 5 refills | Status: AC

## 2024-12-29 NOTE — Telephone Encounter (Signed)
 Pt would like to stay at dose he is at

## 2024-12-29 NOTE — Telephone Encounter (Signed)
 Pt is taking mounjaro  12.5mg . he is not taking ozempic 

## 2024-12-30 ENCOUNTER — Other Ambulatory Visit (HOSPITAL_COMMUNITY): Payer: Self-pay

## 2025-01-03 ENCOUNTER — Other Ambulatory Visit (HOSPITAL_COMMUNITY): Payer: Self-pay

## 2025-01-06 ENCOUNTER — Other Ambulatory Visit (HOSPITAL_COMMUNITY): Payer: Self-pay

## 2025-01-06 ENCOUNTER — Telehealth: Payer: Self-pay | Admitting: Pharmacy Technician

## 2025-01-06 NOTE — Telephone Encounter (Signed)
 Pharmacy Patient Advocate Encounter   Received notification from [facility] KEY that prior authorization for Mounjaro  12.5mg /0.59ml pen is required/requested.   Insurance verification completed.   The patient is insured through [employer].   Per test claim: PA required; PA submitted to above mentioned insurance via Latent Key/confirmation #/EOC [id number] Status is pending

## 2025-01-10 ENCOUNTER — Other Ambulatory Visit (HOSPITAL_COMMUNITY): Payer: Self-pay

## 2025-01-11 ENCOUNTER — Other Ambulatory Visit (HOSPITAL_COMMUNITY): Payer: Self-pay
